# Patient Record
Sex: Male | Born: 1950 | Race: White | Hispanic: No | Marital: Married | Smoking: Never smoker
Health system: Southern US, Community
[De-identification: ages and names within clinical notes are randomized; demographics above are authoritative.]

## PROBLEM LIST (undated history)

## (undated) DIAGNOSIS — E785 Hyperlipidemia, unspecified: Secondary | ICD-10-CM

## (undated) DIAGNOSIS — I499 Cardiac arrhythmia, unspecified: Secondary | ICD-10-CM

## (undated) DIAGNOSIS — I10 Essential (primary) hypertension: Secondary | ICD-10-CM

## (undated) DIAGNOSIS — B370 Candidal stomatitis: Secondary | ICD-10-CM

## (undated) DIAGNOSIS — R030 Elevated blood-pressure reading, without diagnosis of hypertension: Secondary | ICD-10-CM

## (undated) DIAGNOSIS — M722 Plantar fascial fibromatosis: Secondary | ICD-10-CM

## (undated) DIAGNOSIS — R9439 Abnormal result of other cardiovascular function study: Secondary | ICD-10-CM

## (undated) DIAGNOSIS — M1990 Unspecified osteoarthritis, unspecified site: Secondary | ICD-10-CM

## (undated) HISTORY — PX: JOINT REPLACEMENT: SHX530

## (undated) HISTORY — PX: COLONOSCOPY: SHX174

## (undated) HISTORY — PX: OTHER SURGICAL HISTORY: SHX169

## (undated) HISTORY — PX: TONSILLECTOMY: SUR1361

---

## 1999-02-03 DIAGNOSIS — Z87442 Personal history of urinary calculi: Secondary | ICD-10-CM

## 1999-02-03 HISTORY — DX: Personal history of urinary calculi: Z87.442

## 2003-10-25 ENCOUNTER — Other Ambulatory Visit: Payer: Self-pay

## 2004-02-29 ENCOUNTER — Ambulatory Visit: Payer: Self-pay | Admitting: Unknown Physician Specialty

## 2006-02-14 ENCOUNTER — Emergency Department: Payer: Self-pay | Admitting: Emergency Medicine

## 2006-02-17 ENCOUNTER — Ambulatory Visit: Payer: Self-pay | Admitting: Internal Medicine

## 2010-11-03 ENCOUNTER — Ambulatory Visit: Payer: Self-pay | Admitting: Urology

## 2011-11-04 DIAGNOSIS — Z87442 Personal history of urinary calculi: Secondary | ICD-10-CM | POA: Insufficient documentation

## 2011-11-04 DIAGNOSIS — N401 Enlarged prostate with lower urinary tract symptoms: Secondary | ICD-10-CM | POA: Insufficient documentation

## 2011-11-04 DIAGNOSIS — R3129 Other microscopic hematuria: Secondary | ICD-10-CM | POA: Insufficient documentation

## 2014-04-17 DIAGNOSIS — B370 Candidal stomatitis: Secondary | ICD-10-CM

## 2014-04-17 HISTORY — DX: Candidal stomatitis: B37.0

## 2014-04-25 DIAGNOSIS — R339 Retention of urine, unspecified: Secondary | ICD-10-CM | POA: Insufficient documentation

## 2014-04-25 DIAGNOSIS — R972 Elevated prostate specific antigen [PSA]: Secondary | ICD-10-CM | POA: Insufficient documentation

## 2014-11-07 ENCOUNTER — Encounter: Payer: Self-pay | Admitting: *Deleted

## 2014-11-08 ENCOUNTER — Ambulatory Visit
Admission: RE | Admit: 2014-11-08 | Discharge: 2014-11-08 | Disposition: A | Discharge status: Home / Self Care | Payer: BC Managed Care – PPO | Source: Ambulatory Visit | Attending: Unknown Physician Specialty | Admitting: Unknown Physician Specialty

## 2014-11-08 ENCOUNTER — Encounter
Admission: RE | Disposition: A | Discharge status: Home / Self Care | Payer: Self-pay | Source: Ambulatory Visit | Attending: Unknown Physician Specialty

## 2014-11-08 ENCOUNTER — Ambulatory Visit: Payer: BC Managed Care – PPO | Admitting: Anesthesiology

## 2014-11-08 DIAGNOSIS — K621 Rectal polyp: Secondary | ICD-10-CM | POA: Insufficient documentation

## 2014-11-08 DIAGNOSIS — E785 Hyperlipidemia, unspecified: Secondary | ICD-10-CM | POA: Insufficient documentation

## 2014-11-08 DIAGNOSIS — K640 First degree hemorrhoids: Secondary | ICD-10-CM | POA: Diagnosis not present

## 2014-11-08 DIAGNOSIS — R030 Elevated blood-pressure reading, without diagnosis of hypertension: Secondary | ICD-10-CM | POA: Insufficient documentation

## 2014-11-08 DIAGNOSIS — K5730 Diverticulosis of large intestine without perforation or abscess without bleeding: Secondary | ICD-10-CM | POA: Diagnosis not present

## 2014-11-08 DIAGNOSIS — Z1211 Encounter for screening for malignant neoplasm of colon: Secondary | ICD-10-CM | POA: Insufficient documentation

## 2014-11-08 DIAGNOSIS — Z79899 Other long term (current) drug therapy: Secondary | ICD-10-CM | POA: Insufficient documentation

## 2014-11-08 HISTORY — DX: Plantar fascial fibromatosis: M72.2

## 2014-11-08 HISTORY — DX: Candidal stomatitis: B37.0

## 2014-11-08 HISTORY — DX: Hyperlipidemia, unspecified: E78.5

## 2014-11-08 HISTORY — DX: Elevated blood-pressure reading, without diagnosis of hypertension: R03.0

## 2014-11-08 HISTORY — DX: Abnormal result of other cardiovascular function study: R94.39

## 2014-11-08 HISTORY — PX: COLONOSCOPY WITH PROPOFOL: SHX5780

## 2014-11-08 SURGERY — COLONOSCOPY WITH PROPOFOL
Anesthesia: General

## 2014-11-08 MED ORDER — SODIUM CHLORIDE 0.9 % IV SOLN
INTRAVENOUS | Status: DC
  Administered 2014-11-08: 09:00:00 via INTRAVENOUS

## 2014-11-08 MED ORDER — LIDOCAINE HCL (CARDIAC) 20 MG/ML IV SOLN
INTRAVENOUS | Status: DC | PRN
  Administered 2014-11-08: 40 mg via INTRAVENOUS

## 2014-11-08 MED ORDER — PROPOFOL 10 MG/ML IV BOLUS
INTRAVENOUS | Status: DC | PRN
  Administered 2014-11-08: 50 mg via INTRAVENOUS
  Administered 2014-11-08: 10 mg via INTRAVENOUS
  Administered 2014-11-08: 20 mg via INTRAVENOUS

## 2014-11-08 MED ORDER — SODIUM CHLORIDE 0.9 % IV SOLN: INTRAVENOUS | Status: DC

## 2014-11-08 MED ORDER — PROPOFOL 500 MG/50ML IV EMUL
INTRAVENOUS | Status: DC | PRN
  Administered 2014-11-08: 160 ug/kg/min via INTRAVENOUS

## 2014-11-08 NOTE — H&P (Signed)
   Primary Care Physician:  No primary care provider on file. Primary Gastroenterologist:  Dr. Vira Agar  Pre-Procedure History & Physical: HPI:  Gary Escobar is a 64 y.o. male is here for an colonoscopy.   Past Medical History  Diagnosis Date  . Borderline hypertension   . Hyperlipidemia   . Plantar fasciitis   . Positive cardiac stress test   . Oral thrush     Past Surgical History  Procedure Laterality Date  . Right knee arthroscopy    . Colonoscopy      Prior to Admission medications   Medication Sig Start Date End Date Taking? Authorizing Provider  Flaxseed, Linseed, (FLAXSEED OIL PO) Take by mouth.   Yes Historical Provider, MD  Multiple Vitamin (MULTIVITAMIN) tablet Take 1 tablet by mouth daily.   Yes Historical Provider, MD  nystatin (MYCOSTATIN) 100000 UNIT/ML suspension Take 5 mLs by mouth 4 (four) times daily.   Yes Historical Provider, MD  Omega 3 340 MG CPDR Take by mouth.   Yes Historical Provider, MD    Allergies as of 07/24/2014  . (Not on File)    No family history on file.  Social History   Social History  . Marital Status: Married    Spouse Name: N/A  . Number of Children: N/A  . Years of Education: N/A   Occupational History  . Not on file.   Social History Main Topics  . Smoking status: Not on file  . Smokeless tobacco: Not on file  . Alcohol Use: Not on file  . Drug Use: Not on file  . Sexual Activity: Not on file   Other Topics Concern  . Not on file   Social History Narrative  . No narrative on file    Review of Systems: See HPI, otherwise negative ROS  Physical Exam: BP 117/75 mmHg  Pulse 72  Temp(Src) 98.2 F (36.8 C) (Oral)  Resp 17  Ht 5\' 8"  (1.727 m)  Wt 90.719 kg (200 lb)  BMI 30.42 kg/m2  SpO2 100% General:   Alert,  pleasant and cooperative in NAD Head:  Normocephalic and atraumatic. Neck:  Supple; no masses or thyromegaly. Lungs:  Clear throughout to auscultation.    Heart:  Regular rate and  rhythm. Abdomen:  Soft, nontender and nondistended. Normal bowel sounds, without guarding, and without rebound.   Neurologic:  Alert and  oriented x4;  grossly normal neurologically.  Impression/Plan: Gary Escobar is here for an colonoscopy to be performed for screening  Risks, benefits, limitations, and alternatives regarding  colonoscopy have been reviewed with the patient.  Questions have been answered.  All parties agreeable.   Gaylyn Cheers, MD  11/08/2014, 9:05 AM

## 2014-11-08 NOTE — Anesthesia Postprocedure Evaluation (Signed)
  Anesthesia Post-op Note  Patient: Gary Escobar  Procedure(s) Performed: Procedure(s): COLONOSCOPY WITH PROPOFOL (N/A)  Anesthesia type:General  Patient location: PACU  Post pain: Pain level controlled  Post assessment: Post-op Vital signs reviewed, Patient's Cardiovascular Status Stable, Respiratory Function Stable, Patent Airway and No signs of Nausea or vomiting  Post vital signs: Reviewed and stable  Last Vitals:  Filed Vitals:   11/08/14 1003  BP: 81/62  Pulse: 73  Temp:   Resp: 15    Level of consciousness: awake, alert  and patient cooperative  Complications: No apparent anesthesia complications

## 2014-11-08 NOTE — Op Note (Signed)
[facility] Gastroenterology Patient Name: Gary Escobar Procedure Date: 11/08/2014 9:09 AM MRN: [id number] Account #: [id number] Date of Birth: 1950-12-06 Admit Type: Outpatient Age: 64 Room: [facility] ENDO ROOM 1 Gender: Male Note Status: Finalized Procedure:         Colonoscopy Indications:       Screening for colorectal malignant neoplasm Providers:         Manya Silvas, MD Referring MD:      Tracie Harrier, MD (Referring MD) Medicines:         Propofol per Anesthesia Complications:     No immediate complications. Procedure:         Pre-Anesthesia Assessment:                    - After reviewing the risks and benefits, the patient was                     deemed in satisfactory condition to undergo the procedure.                    After obtaining informed consent, the colonoscope was                     passed under direct vision. Throughout the procedure, the                     patient's blood pressure, pulse, and oxygen saturations                     were monitored continuously. The Olympus PCF-H180AL                     colonoscope ( S#: [id number] ) was introduced through the                     anus and advanced to the the cecum, identified by                     appendiceal orifice and ileocecal valve. The colonoscopy                     was performed without difficulty. The patient tolerated                     the procedure well. The quality of the bowel preparation                     was excellent. Findings:      A 3 mm polyp was found in the rectum. The polyp was sessile. The polyp       was removed with a jumbo cold forceps. Resection and retrieval were       complete.      Many small-mouthed diverticula were found in the sigmoid colon and in       the descending colon.      Internal hemorrhoids were found during endoscopy. The hemorrhoids were       small and Grade I (internal hemorrhoids that do not prolapse).      The exam was otherwise without  abnormality. Impression:        - One 3 mm polyp in the rectum. Resected and retrieved.                    - Diverticulosis in the sigmoid colon and  in the                     descending colon.                    - Internal hemorrhoids.                    - The examination was otherwise normal. Recommendation:    - Await pathology results. Manya Silvas, MD 11/08/2014 9:41:07 AM This report has been signed electronically. Number of Addenda: 0 Note Initiated On: 11/08/2014 9:09 AM Scope Withdrawal Time: 0 hours 13 minutes 4 seconds  Total Procedure Duration: 0 hours 21 minutes 37 seconds       [facility]

## 2014-11-08 NOTE — Anesthesia Procedure Notes (Signed)
Performed by: Ruby Dilone Pre-anesthesia Checklist: Patient identified, Emergency Drugs available, Suction available, Patient being monitored and Timeout performed Patient Re-evaluated:Patient Re-evaluated prior to inductionOxygen Delivery Method: Nasal cannula Intubation Type: IV induction       

## 2014-11-08 NOTE — Transfer of Care (Signed)
Immediate Anesthesia Transfer of Care Note  Patient: Gary Escobar  Procedure(s) Performed: Procedure(s): COLONOSCOPY WITH PROPOFOL (N/A)  Patient Location: PACU  Anesthesia Type:General    Level of Consciousness: sedated  Airway & Oxygen Therapy: Patient Spontanous Breathing and Patient connected to nasal cannula oxygen  Post-op Assessment: Report given to RN and Post -op Vital signs reviewed and stable  Post vital signs: Reviewed and stable  Last Vitals:  Filed Vitals:   11/08/14 0819  BP: 117/75  Pulse: 72  Temp: 36.8 C  Resp: 17    Complications: No apparent anesthesia complications

## 2014-11-08 NOTE — Anesthesia Preprocedure Evaluation (Signed)
Anesthesia Evaluation  Patient identified by MRN, date of birth, ID band Patient awake    Reviewed: Allergy & Precautions, NPO status , Patient's Chart, lab work & pertinent test results  History of Anesthesia Complications (+) history of anesthetic complications  Airway Mallampati: II  TM Distance: >3 FB     Dental  (+) Teeth Intact   Pulmonary neg pulmonary ROS,           Cardiovascular negative cardio ROS       Neuro/Psych negative neurological ROS     GI/Hepatic negative GI ROS, Neg liver ROS,   Endo/Other  negative endocrine ROS  Renal/GU negative Renal ROS     Musculoskeletal   Abdominal   Peds  Hematology   Anesthesia Other Findings   Reproductive/Obstetrics                             Anesthesia Physical Anesthesia Plan  ASA: I  Anesthesia Plan: General   Post-op Pain Management:    Induction: Intravenous  Airway Management Planned: Nasal Cannula  Additional Equipment:   Intra-op Plan:   Post-operative Plan:   Informed Consent: I have reviewed the patients History and Physical, chart, labs and discussed the procedure including the risks, benefits and alternatives for the proposed anesthesia with the patient or authorized representative who has indicated his/her understanding and acceptance.     Plan Discussed with:   Anesthesia Plan Comments:         Anesthesia Quick Evaluation

## 2014-11-09 LAB — SURGICAL PATHOLOGY

## 2014-11-12 ENCOUNTER — Encounter: Payer: Self-pay | Admitting: Unknown Physician Specialty

## 2016-10-22 ENCOUNTER — Ambulatory Visit
Admission: RE | Admit: 2016-10-22 | Discharge: 2016-10-22 | Disposition: A | Discharge status: Home / Self Care | Payer: BC Managed Care – PPO | Source: Ambulatory Visit | Attending: Sports Medicine | Admitting: Sports Medicine

## 2016-10-22 ENCOUNTER — Encounter: Payer: Self-pay | Admitting: Sports Medicine

## 2016-10-22 ENCOUNTER — Ambulatory Visit (INDEPENDENT_AMBULATORY_CARE_PROVIDER_SITE_OTHER): Payer: BC Managed Care – PPO | Admitting: Sports Medicine

## 2016-10-22 VITALS — BP 130/80 | Ht 68.0 in | Wt 204.0 lb

## 2016-10-22 DIAGNOSIS — M2170 Unequal limb length (acquired), unspecified site: Secondary | ICD-10-CM | POA: Insufficient documentation

## 2016-10-22 DIAGNOSIS — M1711 Unilateral primary osteoarthritis, right knee: Secondary | ICD-10-CM | POA: Diagnosis not present

## 2016-10-22 DIAGNOSIS — M1612 Unilateral primary osteoarthritis, left hip: Secondary | ICD-10-CM | POA: Diagnosis not present

## 2016-10-22 DIAGNOSIS — M25561 Pain in right knee: Secondary | ICD-10-CM

## 2016-10-22 MED ORDER — TRAMADOL HCL 50 MG PO TABS: 50.0000 mg | ORAL_TABLET | Freq: Two times a day (BID) | ORAL | 2 refills | Status: DC | PRN

## 2016-10-22 NOTE — Patient Instructions (Signed)
[facility] [address] [place], [place] [phone]

## 2016-10-22 NOTE — Progress Notes (Signed)
HPI  CC: Right knee pain, left hip pain Patient is here with complaints of right-sided knee pain. He states that this ailment has been causing him discomfort for many years. He states that this knee aches along the medial aspect. He has noticed some reduced range of motion specifically with extension as well as some swelling in the back of his knee. He denies any feelings of instability. He endorses occasional swelling. He denies any mechanical locking. He denies any recent trauma, injury, or falls. However, he endorses injuries in the past specifically one in 2006 which required a meniscectomy at that time. Patient's wife states that for many years he seems to "hobble" while walking, which she is concerned it is contributing to much of his pain.  Left hip: Patient is also here with complaints of left hip pain. He states that this discomfort started after the knee but has also been going on for quite some time. Pain is located along the buttock with occasional involvement of the groin. He states that his range of motion in this leg is significantly limited and causes him to have some trouble with putting on socks and shoes. He is worried that this is a results of his "hobbling" gait. He denies any specific injury, trauma, or falls related to this joint.  Traumatic: No -- Knee likely "chronic post-traumatic"  Location: Right knee, left hip  Quality: Aching and stiff  Duration: Many years  Timing: First thing in the morning and after prolonged sitting and standing  Improving/Worsening: Gradually worsening  Makes better: Rest  Makes worse: Persistent ambulation  Associated symptoms: None   Previous Interventions Tried: none   Past Injuries: Meniscus tear in 2006  Past Surgeries: Meniscectomy in 2006  Smoking: non  Family Hx: Noncontributory   ROS: Per HPI; in addition no fever, no rash, no additional weakness, no additional numbness, no additional paresthesias, and no additional  falls/injury.   Objective: BP 130/80   Ht 5\' 8"  (1.727 m)   Wt 204 lb (92.5 kg)   BMI 31.02 kg/m  Gen: NAD, well groomed, a/o x3, normal affect.  CV: Well-perfused. Warm.  Resp: Non-labored.  Neuro: Sensation intact throughout. No gross coordination deficits.  Gait: Nonpathologic posture, significant Trendelenburg gait on the left. Right knee has a varus thrust. Knee, Right: TTP noted at the medial joint line. Inspection was negative for erythema or ecchymosis. Mild effusion present. Mildly obvious bony abnormalities  c/w osteophyte development. Palpation yielded no asymmetric warmth; No condyle tenderness; No patellar tenderness; +1 patellar crepitus. Patellar and quadriceps tendons unremarkable, and no tenderness of the pes anserine bursa. Moderately sized Baker's cyst noted. ROM limited in flexion (~110 degrees) and extension (10 degrees). Normal hamstring and quadriceps strength. Neurovascularly intact bilaterally.  - Ligaments: (Solid and consistent endpoints)   - ACL (present bilaterally)   - PCL (present bilaterally)   - LCL (present bilaterally)   - MCL (present bilaterally).   - Meniscus:   - Thessaly: NEG  - Patella:   - Patellar grind/compression: NEG   - Patellar glide: Without apprehension Hip, Left: TTP noted at the mid buttock and posterior GT, as well as the groin. No obvious rash, erythema, ecchymosis, or edema. ROM limited in IR and ER (IR>ER); Strength intact. Pelvic alignment unremarkable to inspection and palpation. Greater trochanter without tenderness to palpation. No tenderness over piriformis. No SI joint tenderness and normal minimal SI movement. Leg Length: Right side appears to be approximately 1.5 cm longer than the left. Moderate  correction with sitting up.    Assessment and Plan:  Osteoarthritis of right knee Patient is here with complaints  Consistent with right sided osteoarthritis. Likely posttraumatic, chronic.  - Conservative therapy at this time.  Had a long discussion about the likelihood of total knee arthroplasty as a cure. - Obtaining standing x-rays. - Provided left-sided heel lift to help with leg length discrepancy - Tramadol as needed for pain - Follow-up as needed  Primary osteoarthritis of left hip Patient is here with complaints consistent with primary osteoarthritis of the left hip. Likely some association with patient's right knee issues, and leg length discrepancy. - Discussed the possibility of hip replacement. - Referral to orthopedic surgery - Home exercise program provided - Tramadol provided for pain as needed  Leg length discrepancy See plan above   Orders Placed This Encounter  Procedures  . DG Knee Complete 4 Views Right    Standing Status:   Future    Number of Occurrences:   1    Standing Expiration Date:   12/22/2017    Order Specific Question:   Reason for Exam (SYMPTOM  OR DIAGNOSIS REQUIRED)    Answer:   right knee pain; standing AP, lateral, sunrise and tunnel views    Order Specific Question:   Preferred imaging location?    Answer:   [facility]    Order Specific Question:   Radiology Contrast Protocol - do NOT remove file path    Answer:   [email or web address]  . Ambulatory referral to Orthopedic Surgery    Referral Priority:   Routine    Referral Type:   Surgical    Referral Reason:   Specialty Services Required    Referred to Provider:   Gaynelle Arabian, MD    Requested Specialty:   Orthopedic Surgery    Number of Visits Requested:   1    Meds ordered this encounter  Medications  . traMADol (ULTRAM) 50 MG tablet    Sig: Take 1 tablet (50 mg total) by mouth every 12 (twelve) hours as needed.    Dispense:  60 tablet    Refill:  2     Elberta Leatherwood, MD,MS [facility] 10/22/2016 1:19 PM

## 2016-10-22 NOTE — Assessment & Plan Note (Signed)
See plan above.

## 2016-10-22 NOTE — Assessment & Plan Note (Signed)
Patient is here with complaints consistent with primary osteoarthritis of the left hip. Likely some association with patient's right knee issues, and leg length discrepancy. - Discussed the possibility of hip replacement. - Referral to orthopedic surgery - Home exercise program provided - Tramadol provided for pain as needed

## 2016-10-22 NOTE — Assessment & Plan Note (Signed)
Patient is here with complaints  Consistent with right sided osteoarthritis. Likely posttraumatic, chronic.  - Conservative therapy at this time. Had a long discussion about the likelihood of total knee arthroplasty as a cure. - Obtaining standing x-rays. - Provided left-sided heel lift to help with leg length discrepancy - Tramadol as needed for pain - Follow-up as needed

## 2017-02-02 DIAGNOSIS — I499 Cardiac arrhythmia, unspecified: Secondary | ICD-10-CM

## 2017-02-02 HISTORY — DX: Cardiac arrhythmia, unspecified: I49.9

## 2017-06-29 DIAGNOSIS — Z8042 Family history of malignant neoplasm of prostate: Secondary | ICD-10-CM | POA: Insufficient documentation

## 2018-01-17 DIAGNOSIS — I10 Essential (primary) hypertension: Secondary | ICD-10-CM | POA: Insufficient documentation

## 2018-02-07 DIAGNOSIS — E785 Hyperlipidemia, unspecified: Secondary | ICD-10-CM | POA: Insufficient documentation

## 2018-02-09 ENCOUNTER — Other Ambulatory Visit
Admission: RE | Admit: 2018-02-09 | Discharge: 2018-02-09 | Disposition: A | Discharge status: Home / Self Care | Payer: BC Managed Care – PPO | Source: Ambulatory Visit | Attending: Internal Medicine | Admitting: Internal Medicine

## 2018-02-09 DIAGNOSIS — I4892 Unspecified atrial flutter: Secondary | ICD-10-CM | POA: Diagnosis present

## 2018-02-09 DIAGNOSIS — I4891 Unspecified atrial fibrillation: Secondary | ICD-10-CM | POA: Diagnosis not present

## 2018-02-09 LAB — BRAIN NATRIURETIC PEPTIDE: B Natriuretic Peptide: 36 pg/mL (ref 0.0–100.0)

## 2018-02-11 ENCOUNTER — Ambulatory Visit: Admission: RE | Admit: 2018-02-11 | Payer: BC Managed Care – PPO | Source: Home / Self Care | Admitting: Cardiology

## 2018-02-11 ENCOUNTER — Encounter: Admission: RE | Payer: Self-pay | Source: Home / Self Care

## 2018-02-11 SURGERY — CARDIOVERSION (CATH LAB)
Anesthesia: Moderate Sedation

## 2018-03-10 ENCOUNTER — Ambulatory Visit: Payer: BC Managed Care – PPO | Attending: Neurology

## 2018-03-10 DIAGNOSIS — R400 Somnolence: Secondary | ICD-10-CM | POA: Diagnosis not present

## 2018-03-10 DIAGNOSIS — G4733 Obstructive sleep apnea (adult) (pediatric): Secondary | ICD-10-CM | POA: Insufficient documentation

## 2018-03-10 DIAGNOSIS — I483 Typical atrial flutter: Secondary | ICD-10-CM | POA: Diagnosis not present

## 2018-12-19 DIAGNOSIS — G4733 Obstructive sleep apnea (adult) (pediatric): Secondary | ICD-10-CM | POA: Insufficient documentation

## 2018-12-19 DIAGNOSIS — I4892 Unspecified atrial flutter: Secondary | ICD-10-CM | POA: Insufficient documentation

## 2019-05-11 ENCOUNTER — Ambulatory Visit: Payer: Self-pay | Admitting: Urology

## 2019-06-07 ENCOUNTER — Other Ambulatory Visit: Payer: Self-pay

## 2019-06-07 ENCOUNTER — Ambulatory Visit (INDEPENDENT_AMBULATORY_CARE_PROVIDER_SITE_OTHER): Payer: Medicare PPO | Admitting: Urology

## 2019-06-07 VITALS — BP 129/82 | HR 84 | Ht 68.0 in | Wt 214.0 lb

## 2019-06-07 DIAGNOSIS — N529 Male erectile dysfunction, unspecified: Secondary | ICD-10-CM

## 2019-06-07 DIAGNOSIS — R319 Hematuria, unspecified: Secondary | ICD-10-CM

## 2019-06-07 MED ORDER — TADALAFIL 5 MG PO TABS: 5.0000 mg | ORAL_TABLET | Freq: Every day | ORAL | 11 refills | Status: DC | PRN

## 2019-06-07 NOTE — Progress Notes (Signed)
   06/07/19 10:08 AM   Tobe Sos 22-Jul-1950 [id number]  CC: Hematuria, ED  HPI: I saw Mr. Brazill in urology clinic today for the above issues.  He was previously followed by Dr. Jacqlyn Larsen.  Is a 69 year old relatively healthy male with a cardiac history on Eliquis and a long history of microscopic hematuria.  He last underwent work-up with Dr. Jacqlyn Larsen in 2012 with CT and cystoscopy which was reportedly normal.  He does report one episode of gross hematuria associated with some dysuria during 1 urination about 6 months ago, but no other episodes of gross hematuria.  This resolved spontaneously.  Urinalysis today shows persistent microscopic hematuria with 3-10 RBCs, but no other suspicious findings.  He denies any voiding complaints.  He has moderate erectile dysfunction that previously has been improved with sildenafil, but he has not taken this medication lately.  He has a family history of nonlethal prostate cancer.  He did not tolerate the cystoscopy for prior hematuria work-up well with significant pain reportedly.  He is a never smoker and denies any other carcinogenic exposures.  He is a retired Chief Financial Officer.  He has had significant hip and knee problems and is looking into scheduling joint replacement.  Last PSA was well within the normal range at 0.9 in December 2020.   PMH: Past Medical History:  Diagnosis Date  . Borderline hypertension   . Hyperlipidemia   . Oral thrush   . Plantar fasciitis   . Positive cardiac stress test     Family History: Family history of nonlethal prostate cancer  Social History:  reports that he has never smoked. He has never used smokeless tobacco. No history on file for alcohol and drug.  Physical Exam: BP 129/82   Pulse 84   Ht 5\' 8"  (1.727 m)   Wt 214 lb (97.1 kg)   BMI 32.54 kg/m    Constitutional:  Alert and oriented, No acute distress. Cardiovascular: No clubbing, cyanosis, or edema. Respiratory: Normal respiratory effort, no increased work of  breathing. GI: Abdomen is soft, nontender, nondistended, no abdominal masses DRE: 30 g, limited by body habitus, smooth no nodules  Laboratory Data: Reviewed, see HPI  Assessment & Plan:   In summary, is a 69 year old male with a single episode of gross hematuria associated with dysuria as well as persistent microscopic hematuria, as well as erectile dysfunction.  I did recommend repeat work-up for his hematuria with CT urogram and cystoscopy, he deferred cystoscopy at this time secondary to the significant amount of pain he had previously.  We discussed that findings of the CT urogram may warrant further work-up.  He also is interested in a trial of Cialis for his ED, and we discussed the risk and benefits at length.  Cialis 5 to 10 mg on demand for ED CT urogram RTC 6 weeks to discuss results   Nickolas Madrid, MD 06/07/2019  [facility] [address], [place], [place] [address] [phone]

## 2019-06-07 NOTE — Patient Instructions (Signed)
Prostate Cancer Screening  Prostate cancer screening is a test that is done to check for the presence of prostate cancer in men. The prostate gland is a walnut-sized gland that is located below the bladder and in front of the rectum in males. The function of the prostate is to add fluid to semen during ejaculation. Prostate cancer is the second most common type of cancer in men. Who should have prostate cancer screening?  Screening recommendations vary based on age and other risk factors. Screening is recommended if:  You are older than age 55. If you are age 55-69, talk with your health care provider about your need for screening and how often screening should be done. Because most prostate cancers are slow growing and will not cause death, screening is generally reserved in this age group for men who have a 10-15-year life expectancy.  You are younger than age 55, and you have these risk factors: ? Being a black male or a male of African descent. ? Having a father, brother, or uncle who has been diagnosed with prostate cancer. The risk is higher if your family member's cancer occurred at an early age. Screening is not recommended if:  You are younger than age 40.  You are between the ages of 40 and 54 and you have no risk factors.  You are 69 years of age or older. At this age, the risks that screening can cause are greater than the benefits that it may provide. If you are at high risk for prostate cancer, your health care provider may recommend that you have screenings more often or that you start screening at a younger age. How is screening for prostate cancer done? The recommended prostate cancer screening test is a blood test called the prostate-specific antigen (PSA) test. PSA is a protein that is made in the prostate. As you age, your prostate naturally produces more PSA. Abnormally high PSA levels may be caused by:  Prostate cancer.  An enlarged prostate that is not caused by cancer  (benign prostatic hyperplasia, BPH). This condition is very common in older men.  A prostate gland infection (prostatitis). Depending on the PSA results, you may need more tests, such as:  A physical exam to check the size of your prostate gland.  Blood and imaging tests.  A procedure to remove tissue samples from your prostate gland for testing (biopsy). What are the benefits of prostate cancer screening?  Screening can help to identify cancer at an early stage, before symptoms start and when the cancer can be treated more easily.  There is a small chance that screening may lower your risk of dying from prostate cancer. The chance is small because prostate cancer is a slow-growing cancer, and most men with prostate cancer die from a different cause. What are the risks of prostate cancer screening? The main risk of prostate cancer screening is diagnosing and treating prostate cancer that would never have caused any symptoms or problems. This is called overdiagnosisand overtreatment. PSA screening cannot tell you if your PSA is high due to cancer or a different cause. A prostate biopsy is the only procedure to diagnose prostate cancer. Even the results of a biopsy may not tell you if your cancer needs to be treated. Slow-growing prostate cancer may not need any treatment other than monitoring, so diagnosing and treating it may cause unnecessary stress or other side effects. A prostate biopsy may also cause:  Infection or fever.  A false negative. This is   a result that shows that you do not have prostate cancer when you actually do have prostate cancer. Questions to ask your health care provider  When should I start prostate cancer screening?  What is my risk for prostate cancer?  How often do I need screening?  What type of screening tests do I need?  How do I get my test results?  What do my results mean?  Do I need treatment? Where to find more information  The American Cancer  Society: www.cancer.org  American Urological Association: www.auanet.org Contact a health care provider if:  You have difficulty urinating.  You have pain when you urinate or ejaculate.  You have blood in your urine or semen.  You have pain in your back or in the area of your prostate. Summary  Prostate cancer is a common type of cancer in men. The prostate gland is located below the bladder and in front of the rectum. This gland adds fluid to semen during ejaculation.  Prostate cancer screening may identify cancer at an early stage, when the cancer can be treated more easily.  The prostate-specific antigen (PSA) test is the recommended screening test for prostate cancer.  Discuss the risks and benefits of prostate cancer screening with your health care provider. If you are age 69 or older, the risks that screening can cause are greater than the benefits that it may provide. This information is not intended to replace advice given to you by your health care provider. Make sure you discuss any questions you have with your health care provider. Document Revised: 09/01/2018 Document Reviewed: 09/01/2018 Elsevier Patient Education  2020 [employer].  Tadalafil tablets (Cialis) What is this medicine? TADALAFIL (tah DA la fil) is used to treat erection problems in men. It is also used for enlargement of the prostate gland in men, a condition called benign prostatic hyperplasia or BPH. This medicine improves urine flow and reduces BPH symptoms. This medicine can also treat both erection problems and BPH when they occur together. This medicine may be used for other purposes; ask your health care provider or pharmacist if you have questions. COMMON BRAND NAME(S): Adcirca, ALYQ, Cialis What should I tell my health care provider before I take this medicine? They need to know if you have any of these conditions:  bleeding disorders  eye or vision problems, including a rare inherited eye  disease called retinitis pigmentosa  anatomical deformation of the penis, Peyronie's disease, or history of priapism (painful and prolonged erection)  heart disease, angina, a history of heart attack, irregular heart beats, or other heart problems  high or low blood pressure  history of blood diseases, like sickle cell anemia or leukemia  history of stomach bleeding  kidney disease  liver disease  stroke  an unusual or allergic reaction to tadalafil, other medicines, foods, dyes, or preservatives  pregnant or trying to get pregnant  breast-feeding How should I use this medicine? Take this medicine by mouth with a glass of water. Follow the directions on the prescription label. You may take this medicine with or without meals. When this medicine is used for erection problems, your doctor may prescribe it to be taken once daily or as needed. If you are taking the medicine as needed, you may be able to have sexual activity 30 minutes after taking it and for up to 36 hours after taking it. Whether you are taking the medicine as needed or once daily, you should not take more than one dose   per day. If you are taking this medicine for symptoms of benign prostatic hyperplasia (BPH) or to treat both BPH and an erection problem, take the dose once daily at about the same time each day. Do not take your medicine more often than directed. Talk to your pediatrician regarding the use of this medicine in children. Special care may be needed. Overdosage: If you think you have taken too much of this medicine contact a poison control center or emergency room at once. NOTE: This medicine is only for you. Do not share this medicine with others. What if I miss a dose? If you are taking this medicine as needed for erection problems, this does not apply. If you miss a dose while taking this medicine once daily for an erection problem, benign prostatic hyperplasia, or both, take it as soon as you remember, but  do not take more than one dose per day. What may interact with this medicine? Do not take this medicine with any of the following medications:  nitrates like amyl nitrite, isosorbide dinitrate, isosorbide mononitrate, nitroglycerin  other medicines for erectile dysfunction like avanafil, sildenafil, vardenafil  other tadalafil products (Adcirca)  riociguat This medicine may also interact with the following medications:  certain drugs for high blood pressure  certain drugs for the treatment of HIV infection or AIDS  certain drugs used for fungal or yeast infections, like fluconazole, itraconazole, ketoconazole, and voriconazole  certain drugs used for seizures like carbamazepine, phenytoin, and phenobarbital  grapefruit juice  macrolide antibiotics like clarithromycin, erythromycin, troleandomycin  medicines for prostate problems  rifabutin, rifampin or rifapentine This list may not describe all possible interactions. Give your health care provider a list of all the medicines, herbs, non-prescription drugs, or dietary supplements you use. Also tell them if you smoke, drink alcohol, or use illegal drugs. Some items may interact with your medicine. What should I watch for while using this medicine? If you notice any changes in your vision while taking this drug, call your doctor or health care professional as soon as possible. Stop using this medicine and call your health care provider right away if you have a loss of sight in one or both eyes. Contact your doctor or health care professional right away if the erection lasts longer than 4 hours or if it becomes painful. This may be a sign of serious problem and must be treated right away to prevent permanent damage. If you experience symptoms of nausea, dizziness, chest pain or arm pain upon initiation of sexual activity after taking this medicine, you should refrain from further activity and call your doctor or health care professional  as soon as possible. Do not drink alcohol to excess (examples, 5 glasses of wine or 5 shots of whiskey) when taking this medicine. When taken in excess, alcohol can increase your chances of getting a headache or getting dizzy, increasing your heart rate or lowering your blood pressure. Using this medicine does not protect you or your partner against HIV infection (the virus that causes AIDS) or other sexually transmitted diseases. What side effects may I notice from receiving this medicine? Side effects that you should report to your doctor or health care professional as soon as possible:  allergic reactions like skin rash, itching or hives, swelling of the face, lips, or tongue  breathing problems  changes in hearing  changes in vision  chest pain  fast, irregular heartbeat  prolonged or painful erection  seizures Side effects that usually do not require medical   attention (report to your doctor or health care professional if they continue or are bothersome):  back pain  dizziness  flushing  headache  indigestion  muscle aches  nausea  stuffy or runny nose This list may not describe all possible side effects. Call your doctor for medical advice about side effects. You may report side effects to FDA at 1-800-FDA-1088. Where should I keep my medicine? Keep out of the reach of children. Store at room temperature between 15 and 30 degrees C (59 and 86 degrees F). Throw away any unused medicine after the expiration date. NOTE: This sheet is a summary. It may not cover all possible information. If you have questions about this medicine, talk to your doctor, pharmacist, or health care provider.  2020 Elsevier/Gold Standard (2013-06-09 13:15:49)   

## 2019-06-08 LAB — MICROSCOPIC EXAMINATION

## 2019-06-08 LAB — URINALYSIS, COMPLETE
Bilirubin, UA: NEGATIVE
Glucose, UA: NEGATIVE
Ketones, UA: NEGATIVE
Leukocytes,UA: NEGATIVE
Nitrite, UA: NEGATIVE
Protein,UA: NEGATIVE
Specific Gravity, UA: 1.025 (ref 1.005–1.030)
Urobilinogen, Ur: 0.2 mg/dL (ref 0.2–1.0)
pH, UA: 5.5 (ref 5.0–7.5)

## 2019-07-04 ENCOUNTER — Ambulatory Visit
Admission: RE | Admit: 2019-07-04 | Discharge: 2019-07-04 | Disposition: A | Discharge status: Home / Self Care | Payer: Medicare PPO | Source: Ambulatory Visit | Attending: Urology | Admitting: Urology

## 2019-07-04 ENCOUNTER — Telehealth: Payer: Self-pay

## 2019-07-04 ENCOUNTER — Other Ambulatory Visit: Payer: Self-pay

## 2019-07-04 DIAGNOSIS — R319 Hematuria, unspecified: Secondary | ICD-10-CM | POA: Insufficient documentation

## 2019-07-04 LAB — POCT I-STAT CREATININE: Creatinine, Ser: 0.9 mg/dL (ref 0.61–1.24)

## 2019-07-04 MED ORDER — IOHEXOL 300 MG/ML  SOLN
125.0000 mL | Freq: Once | INTRAMUSCULAR | Status: AC | PRN
  Administered 2019-07-04: 125 mL via INTRAVENOUS

## 2019-07-04 NOTE — Telephone Encounter (Signed)
Called pt informed him of the information below. Pt gave verbal understanding.  

## 2019-07-04 NOTE — Telephone Encounter (Signed)
-----   Message from Billey Co, MD sent at 07/04/2019 11:12 AM EDT ----- Nothing worrisome on CT for microscopic hematuria. Keep follow up as scheduled. He is not doing the cysto Floy Sabina, MD 07/04/2019

## 2019-07-19 ENCOUNTER — Other Ambulatory Visit: Payer: Self-pay

## 2019-07-19 ENCOUNTER — Ambulatory Visit: Payer: Medicare PPO | Admitting: Urology

## 2019-07-19 ENCOUNTER — Encounter: Payer: Self-pay | Admitting: Urology

## 2019-07-19 DIAGNOSIS — R319 Hematuria, unspecified: Secondary | ICD-10-CM | POA: Diagnosis not present

## 2019-07-19 DIAGNOSIS — Z125 Encounter for screening for malignant neoplasm of prostate: Secondary | ICD-10-CM

## 2019-07-19 NOTE — Patient Instructions (Addendum)
Prostate Cancer Screening  Prostate cancer screening is a test that is done to check for the presence of prostate cancer in men. The prostate gland is a walnut-sized gland that is located below the bladder and in front of the rectum in males. The function of the prostate is to add fluid to semen during ejaculation. Prostate cancer is the second most common type of cancer in men. Who should have prostate cancer screening?  Screening recommendations vary based on age and other risk factors. Screening is recommended if:  You are older than age 55. If you are age 55-69, talk with your health care provider about your need for screening and how often screening should be done. Because most prostate cancers are slow growing and will not cause death, screening is generally reserved in this age group for men who have a 10-15-year life expectancy.  You are younger than age 55, and you have these risk factors: ? Being a black male or a male of African descent. ? Having a father, brother, or uncle who has been diagnosed with prostate cancer. The risk is higher if your family member's cancer occurred at an early age. Screening is not recommended if:  You are younger than age 40.  You are between the ages of 40 and 54 and you have no risk factors.  You are 70 years of age or older. At this age, the risks that screening can cause are greater than the benefits that it may provide. If you are at high risk for prostate cancer, your health care provider may recommend that you have screenings more often or that you start screening at a younger age. How is screening for prostate cancer done? The recommended prostate cancer screening test is a blood test called the prostate-specific antigen (PSA) test. PSA is a protein that is made in the prostate. As you age, your prostate naturally produces more PSA. Abnormally high PSA levels may be caused by:  Prostate cancer.  An enlarged prostate that is not caused by cancer  (benign prostatic hyperplasia, BPH). This condition is very common in older men.  A prostate gland infection (prostatitis). Depending on the PSA results, you may need more tests, such as:  A physical exam to check the size of your prostate gland.  Blood and imaging tests.  A procedure to remove tissue samples from your prostate gland for testing (biopsy). What are the benefits of prostate cancer screening?  Screening can help to identify cancer at an early stage, before symptoms start and when the cancer can be treated more easily.  There is a small chance that screening may lower your risk of dying from prostate cancer. The chance is small because prostate cancer is a slow-growing cancer, and most men with prostate cancer die from a different cause. What are the risks of prostate cancer screening? The main risk of prostate cancer screening is diagnosing and treating prostate cancer that would never have caused any symptoms or problems. This is called overdiagnosisand overtreatment. PSA screening cannot tell you if your PSA is high due to cancer or a different cause. A prostate biopsy is the only procedure to diagnose prostate cancer. Even the results of a biopsy may not tell you if your cancer needs to be treated. Slow-growing prostate cancer may not need any treatment other than monitoring, so diagnosing and treating it may cause unnecessary stress or other side effects. A prostate biopsy may also cause:  Infection or fever.  A false negative. This is   a result that shows that you do not have prostate cancer when you actually do have prostate cancer. Questions to ask your health care provider  When should I start prostate cancer screening?  What is my risk for prostate cancer?  How often do I need screening?  What type of screening tests do I need?  How do I get my test results?  What do my results mean?  Do I need treatment? Where to find more information  The American Cancer  Society: www.cancer.org  American Urological Association: www.auanet.org Contact a health care provider if:  You have difficulty urinating.  You have pain when you urinate or ejaculate.  You have blood in your urine or semen.  You have pain in your back or in the area of your prostate. Summary  Prostate cancer is a common type of cancer in men. The prostate gland is located below the bladder and in front of the rectum. This gland adds fluid to semen during ejaculation.  Prostate cancer screening may identify cancer at an early stage, when the cancer can be treated more easily.  The prostate-specific antigen (PSA) test is the recommended screening test for prostate cancer.  Discuss the risks and benefits of prostate cancer screening with your health care provider. If you are age 70 or older, the risks that screening can cause are greater than the benefits that it may provide. This information is not intended to replace advice given to you by your health care provider. Make sure you discuss any questions you have with your health care provider. Document Revised: 09/01/2018 Document Reviewed: 09/01/2018 Elsevier Patient Education  2020 Else[employer] Inc.  Tadalafil tablets (Cialis) What is this medicine? TADALAFIL (tah DA la fil) is used to treat erection problems in men. It is also used for enlargement of the prostate gland in men, a condition called benign prostatic hyperplasia or BPH. This medicine improves urine flow and reduces BPH symptoms. This medicine can also treat both erection problems and BPH when they occur together. This medicine may be used for other purposes; ask your health care provider or pharmacist if you have questions. COMMON BRAND NAME(S): Adcirca, ALYQ, Cialis What should I tell my health care provider before I take this medicine? They need to know if you have any of these conditions:  bleeding disorders  eye or vision problems, including a rare inherited eye  disease called retinitis pigmentosa  anatomical deformation of the penis, Peyronie's disease, or history of priapism (painful and prolonged erection)  heart disease, angina, a history of heart attack, irregular heart beats, or other heart problems  high or low blood pressure  history of blood diseases, like sickle cell anemia or leukemia  history of stomach bleeding  kidney disease  liver disease  stroke  an unusual or allergic reaction to tadalafil, other medicines, foods, dyes, or preservatives  pregnant or trying to get pregnant  breast-feeding How should I use this medicine? Take this medicine by mouth with a glass of water. Follow the directions on the prescription label. You may take this medicine with or without meals. When this medicine is used for erection problems, your doctor may prescribe it to be taken once daily or as needed. If you are taking the medicine as needed, you may be able to have sexual activity 30 minutes after taking it and for up to 36 hours after taking it. Whether you are taking the medicine as needed or once daily, you should not take more than one dose   per day. If you are taking this medicine for symptoms of benign prostatic hyperplasia (BPH) or to treat both BPH and an erection problem, take the dose once daily at about the same time each day. Do not take your medicine more often than directed. Talk to your pediatrician regarding the use of this medicine in children. Special care may be needed. Overdosage: If you think you have taken too much of this medicine contact a poison control center or emergency room at once. NOTE: This medicine is only for you. Do not share this medicine with others. What if I miss a dose? If you are taking this medicine as needed for erection problems, this does not apply. If you miss a dose while taking this medicine once daily for an erection problem, benign prostatic hyperplasia, or both, take it as soon as you remember, but  do not take more than one dose per day. What may interact with this medicine? Do not take this medicine with any of the following medications:  nitrates like amyl nitrite, isosorbide dinitrate, isosorbide mononitrate, nitroglycerin  other medicines for erectile dysfunction like avanafil, sildenafil, vardenafil  other tadalafil products (Adcirca)  riociguat This medicine may also interact with the following medications:  certain drugs for high blood pressure  certain drugs for the treatment of HIV infection or AIDS  certain drugs used for fungal or yeast infections, like fluconazole, itraconazole, ketoconazole, and voriconazole  certain drugs used for seizures like carbamazepine, phenytoin, and phenobarbital  grapefruit juice  macrolide antibiotics like clarithromycin, erythromycin, troleandomycin  medicines for prostate problems  rifabutin, rifampin or rifapentine This list may not describe all possible interactions. Give your health care provider a list of all the medicines, herbs, non-prescription drugs, or dietary supplements you use. Also tell them if you smoke, drink alcohol, or use illegal drugs. Some items may interact with your medicine. What should I watch for while using this medicine? If you notice any changes in your vision while taking this drug, call your doctor or health care professional as soon as possible. Stop using this medicine and call your health care provider right away if you have a loss of sight in one or both eyes. Contact your doctor or health care professional right away if the erection lasts longer than 4 hours or if it becomes painful. This may be a sign of serious problem and must be treated right away to prevent permanent damage. If you experience symptoms of nausea, dizziness, chest pain or arm pain upon initiation of sexual activity after taking this medicine, you should refrain from further activity and call your doctor or health care professional  as soon as possible. Do not drink alcohol to excess (examples, 5 glasses of wine or 5 shots of whiskey) when taking this medicine. When taken in excess, alcohol can increase your chances of getting a headache or getting dizzy, increasing your heart rate or lowering your blood pressure. Using this medicine does not protect you or your partner against HIV infection (the virus that causes AIDS) or other sexually transmitted diseases. What side effects may I notice from receiving this medicine? Side effects that you should report to your doctor or health care professional as soon as possible:  allergic reactions like skin rash, itching or hives, swelling of the face, lips, or tongue  breathing problems  changes in hearing  changes in vision  chest pain  fast, irregular heartbeat  prolonged or painful erection  seizures Side effects that usually do not require medical   attention (report to your doctor or health care professional if they continue or are bothersome):  back pain  dizziness  flushing  headache  indigestion  muscle aches  nausea  stuffy or runny nose This list may not describe all possible side effects. Call your doctor for medical advice about side effects. You may report side effects to FDA at 1-800-FDA-1088. Where should I keep my medicine? Keep out of the reach of children. Store at room temperature between 15 and 30 degrees C (59 and 86 degrees F). Throw away any unused medicine after the expiration date. NOTE: This sheet is a summary. It may not cover all possible information. If you have questions about this medicine, talk to your doctor, pharmacist, or health care provider.  2020 Elsevier/Gold Standard (2013-06-09 13:15:49)   

## 2019-07-19 NOTE — Progress Notes (Signed)
   07/19/2019 10:48 AM   Tobe Sos 05/04/1950 [id number]  Reason for visit: Follow up hematuria, ED, PSA screening  HPI: I saw Mr. Gayler back in urology clinic for the above issues.  He is a 69 year old male with a long history of microscopic hematuria and multiple negative work-ups previously.  He had one episode of gross hematuria in the last year, persistent microscopic hematuria, and using shared decision-making he opted for CT urogram but deferring the cystoscopy.  CT urogram on 07/04/2019 was completely benign with no suspicious findings.  We again discussed today the low, but not 0, risk of having a bladder malignancy detected by deferring cystoscopy.  He also mentioned erectile dysfunction at his last visit and has had good results with the 10 mg Cialis on demand.  We reviewed the risks and benefits of this medication again at length, as well as options for taking it as needed versus every other day.  Finally, regarding PSA screening, most recent PSA was 0.93 in December 2020, well within the normal range.  DRE's have been benign in the past.  We reviewed the AUA guidelines that do not recommend routine PSA screening in men over age 34, and we discussed the risks and benefits of screening at length.  He would like to repeat another PSA in a year, and then reconsider discontinuing screening.  RTC 1 year with PSA prior Continue Cialis for ED  Billey Co, MD  [facility] [address] [place], [place] [address] [phone]

## 2019-07-24 LAB — URINALYSIS, COMPLETE
Bilirubin, UA: NEGATIVE
Glucose, UA: NEGATIVE
Ketones, UA: NEGATIVE
Leukocytes,UA: NEGATIVE
Nitrite, UA: NEGATIVE
Protein,UA: NEGATIVE
Specific Gravity, UA: 1.015 (ref 1.005–1.030)
Urobilinogen, Ur: 0.2 mg/dL (ref 0.2–1.0)
pH, UA: 5 (ref 5.0–7.5)

## 2019-07-24 LAB — MICROSCOPIC EXAMINATION
Bacteria, UA: NONE SEEN
Epithelial Cells (non renal): NONE SEEN /hpf (ref 0–10)

## 2019-09-27 ENCOUNTER — Encounter: Payer: Self-pay | Admitting: Dermatology

## 2019-09-27 ENCOUNTER — Ambulatory Visit: Payer: Medicare PPO | Admitting: Dermatology

## 2019-09-27 ENCOUNTER — Other Ambulatory Visit: Payer: Self-pay

## 2019-09-27 DIAGNOSIS — L578 Other skin changes due to chronic exposure to nonionizing radiation: Secondary | ICD-10-CM

## 2019-09-27 DIAGNOSIS — L821 Other seborrheic keratosis: Secondary | ICD-10-CM

## 2019-09-27 DIAGNOSIS — D1800 Hemangioma unspecified site: Secondary | ICD-10-CM

## 2019-09-27 DIAGNOSIS — L738 Other specified follicular disorders: Secondary | ICD-10-CM

## 2019-09-27 DIAGNOSIS — D485 Neoplasm of uncertain behavior of skin: Secondary | ICD-10-CM | POA: Diagnosis not present

## 2019-09-27 DIAGNOSIS — Z1283 Encounter for screening for malignant neoplasm of skin: Secondary | ICD-10-CM | POA: Diagnosis not present

## 2019-09-27 DIAGNOSIS — B351 Tinea unguium: Secondary | ICD-10-CM

## 2019-09-27 DIAGNOSIS — D229 Melanocytic nevi, unspecified: Secondary | ICD-10-CM | POA: Diagnosis not present

## 2019-09-27 DIAGNOSIS — D239 Other benign neoplasm of skin, unspecified: Secondary | ICD-10-CM

## 2019-09-27 DIAGNOSIS — B353 Tinea pedis: Secondary | ICD-10-CM

## 2019-09-27 DIAGNOSIS — D489 Neoplasm of uncertain behavior, unspecified: Secondary | ICD-10-CM

## 2019-09-27 DIAGNOSIS — L814 Other melanin hyperpigmentation: Secondary | ICD-10-CM

## 2019-09-27 HISTORY — DX: Other benign neoplasm of skin, unspecified: D23.9

## 2019-09-27 NOTE — Patient Instructions (Addendum)
Recommend daily broad spectrum sunscreen SPF 30+ to sun-exposed areas, reapply every 2 hours as needed. Call for new or changing lesions.   Onychomycosis: start over the counter Vick's Vapor Rub.  Athletes foot start over the counter Lamisil cream to nails and all over foot  Recommend taking Heliocare sun protection supplement daily in sunny weather for additional sun protection. For maximum protection on the sunniest days, you can take up to 2 capsules of regular Heliocare OR take 1 capsule of Heliocare Ultra. For prolonged exposure (such as a full day in the sun), you can repeat your dose of the supplement 4 hours after your first dose. Heliocare can be purchased at [facility] or at [email or web address].    Wound Care Instructions  1. Cleanse wound gently with soap and water once a day then pat dry with clean gauze. Apply a thing coat of Petrolatum (petroleum jelly, "Vaseline") over the wound (unless you have an allergy to this). We recommend that you use a new, sterile tube of Vaseline. Do not pick or remove scabs. Do not remove the yellow or white "healing tissue" from the base of the wound.  2. Cover the wound with fresh, clean, nonstick gauze and secure with paper tape. You may use Band-Aids in place of gauze and tape if the would is small enough, but would recommend trimming much of the tape off as there is often too much. Sometimes Band-Aids can irritate the skin.  3. You should call the office for your biopsy report after 1 week if you have not already been contacted.  4. If you experience any problems, such as abnormal amounts of bleeding, swelling, significant bruising, significant pain, or evidence of infection, please call the office immediately.  5. FOR ADULT SURGERY PATIENTS: If you need something for pain relief you may take 1 extra strength Tylenol (acetaminophen) AND 2 Ibuprofen (200mg  each) together every 4 hours as needed for pain. (do not take these if you are allergic  to them or if you have a reason you should not take them.) Typically, you may only need pain medication for 1 to 3 days.

## 2019-09-27 NOTE — Progress Notes (Signed)
Follow-Up Visit   Subjective  Gary Escobar is a 69 y.o. male who presents for the following: full body skin exam and skin cancer screening  Patient presents today for annual TBSE, has some areas of concern on his back, left leg, and behind right ear. Patient does not have a h/o skin cancer. He has chronically thickened toenails.   The following portions of the chart were reviewed this encounter and updated as appropriate:  Tobacco   Allergies   Meds   Problems   Med Hx   Surg Hx   Fam Hx       Review of Systems:  No other skin or systemic complaints except as noted in HPI or Assessment and Plan.  Objective  Well appearing patient in no apparent distress; mood and affect are within normal limits.  A full examination was performed including scalp, head, eyes, ears, nose, lips, neck, chest, axillae, abdomen, back, buttocks, bilateral upper extremities, bilateral lower extremities, hands, feet, fingers, toes, fingernails, and toenails. All findings within normal limits unless otherwise noted below.  Objective  Right Upper Back: 0.8 cm irregular dark and light brown waxy papule       Objective  Left Lateral Thigh: 0.3 cm irregular dark brown thin papule       Objective  multiple toenails: Nail dystrophy  Objective  B/L foot: Scaling and maceration web spaces and over distal and lateral soles.    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  Skin / nail biopsy Type of biopsy: tangential   Informed consent: discussed and consent obtained   Timeout: patient name, date of birth, surgical site, and procedure verified   Procedure prep:  Patient was prepped and draped in usual sterile fashion Prep type:  Isopropyl alcohol Anesthesia: the lesion was anesthetized in a standard fashion   Anesthetic:  1% lidocaine w/ epinephrine 1-100,000 buffered w/ 8.4% NaHCO3 Instrument used: flexible razor blade   Hemostasis achieved with: aluminum chloride    Outcome: patient tolerated procedure well   Post-procedure details: sterile dressing applied and wound care instructions given   Dressing type: bandage (mupirocin)    Specimen 1 - Surgical pathology Differential Diagnosis: SK vs collision of sk and atypical nevus Check Margins: No 0.8 cm irregular dark and light brown waxy papule  Left Lateral Thigh  Epidermal / dermal shaving  Lesion diameter (cm):  0.5 Informed consent: discussed and consent obtained   Timeout: patient name, date of birth, surgical site, and procedure verified   Anesthesia: the lesion was anesthetized in a standard fashion   Anesthetic:  1% lidocaine w/ epinephrine 1-100,000 buffered w/ 8.4% NaHCO3 Instrument used: flexible razor blade   Outcome: patient tolerated procedure well   Post-procedure details: sterile dressing applied and wound care instructions given   Dressing type: petrolatum and pressure dressing    Specimen 2 - Surgical pathology Differential Diagnosis: R/O Atypia Check Margins: No  Onychomycosis multiple toenails  Chronic. Discussed therapy options including pulse PO terbinafine and topical rx products. Deferred at this time.    Start OTC Vick's vapor Rub   Tinea pedis of right foot B/L foot  Apply OTC terbinafine twice daily to toes and all over foot   Lentigines - Scattered tan macules - Discussed due to sun exposure - Benign, observe - Call for any changes  Seborrheic Keratoses - Stuck-on, waxy, tan-brown papules and plaques  - Discussed benign etiology and prognosis. - Observe - Call for any changes  Melanocytic Nevi - Tan-brown and/or pink-flesh-colored symmetric macules and papules - Benign appearing on exam today - Observation - Call clinic for new or changing moles - Recommend daily use of broad spectrum spf 30+ sunscreen to sun-exposed areas.   Hemangiomas - Red papules - Discussed benign nature - Observe - Call for any changes  Actinic Damage - diffuse  scaly erythematous macules with underlying dyspigmentation - Recommend daily broad spectrum sunscreen SPF 30+ to sun-exposed areas, reapply every 2 hours as needed.  - Call for new or changing lesions.  Sebaceous Hyperplasia - Small yellow papules with a central dell - Benign - Observe   Skin cancer screening performed today.   Return in about 1 year (around 09/26/2020) for TBSE.  I, Donzetta Kohut, CMA, am acting as scribe for Forest Gleason, MD .  Documentation: I have reviewed the above documentation for accuracy and completeness, and I agree with the above.  Forest Gleason, MD

## 2019-09-29 NOTE — Progress Notes (Signed)
1. Skin , (A) right upper back PIGMENTED SEBORRHEIC KERATOSIS  This is a benign growth or "wisdom spot". No additional treatment is needed.   2. Skin , (B) left lateral thigh DYSPLASTIC JUNCTIONAL LENTIGINOUS NEVUS WITH MODERATE ATYPIA, IRRITATED, LIMITED MARGINS FREE  This is a MODERATELY ATYPICAL MOLE. On the spectrum from normal mole to melanoma skin cancer, this is in between the two. - We need to recheck this area sometime in the next 6 months to be sure there is no evidence of the atypical mole coming back. If there is any color coming back, we would recommend repeating the biopsy to be sure the cells look normal.  - People who have a history of atypical moles do have a slightly increased risk of developing melanoma somewhere on the body, so a yearly full body skin exam by a dermatologist is recommended.  - Monthly self skin checks and daily sun protection are also recommended.  - Please call if you notice a dark spot coming back where this biopsy was taken.  - Please also call if you notice any new or changing spots anywhere else on the body before your follow-up visit.    MAs please call

## 2019-10-03 ENCOUNTER — Encounter: Payer: Self-pay | Admitting: Dermatology

## 2019-10-03 ENCOUNTER — Telehealth: Payer: Self-pay

## 2019-10-03 NOTE — Telephone Encounter (Signed)
Left patient message advising him to call with any questions concerning his biopsy results. Also sent him a message via MyChart to contact the office so that we can get him scheduled for a 6 month follow up, JS

## 2019-10-03 NOTE — Telephone Encounter (Signed)
-----   Message from Alfonso Patten, MD sent at 09/29/2019  5:43 PM EDT ----- 1. Skin , (A) right upper back PIGMENTED SEBORRHEIC KERATOSIS  This is a benign growth or "wisdom spot". No additional treatment is needed.   2. Skin , (B) left lateral thigh DYSPLASTIC JUNCTIONAL LENTIGINOUS NEVUS WITH MODERATE ATYPIA, IRRITATED, LIMITED MARGINS FREE  This is a MODERATELY ATYPICAL MOLE. On the spectrum from normal mole to melanoma skin cancer, this is in between the two. - We need to recheck this area sometime in the next 6 months to be sure there is no evidence of the atypical mole coming back. If there is any color coming back, we would recommend repeating the biopsy to be sure the cells look normal.  - People who have a history of atypical moles do have a slightly increased risk of developing melanoma somewhere on the body, so a yearly full body skin exam by a dermatologist is recommended.  - Monthly self skin checks and daily sun protection are also recommended.  - Please call if you notice a dark spot coming back where this biopsy was taken.  - Please also call if you notice any new or changing spots anywhere else on the body before your follow-up visit.    MAs please call

## 2019-12-12 NOTE — Patient Instructions (Addendum)
DUE TO COVID-19 ONLY ONE VISITOR IS ALLOWED TO COME WITH YOU AND STAY IN THE WAITING ROOM ONLY DURING PRE OP AND PROCEDURE DAY OF SURGERY. THE 1 VISITOR  MAY VISIT WITH YOU AFTER SURGERY IN YOUR PRIVATE ROOM DURING VISITING HOURS ONLY!  YOU NEED TO HAVE A COVID 19 TEST ON_11/13______ @_11 :25______, THIS TEST MUST BE DONE BEFORE SURGERY,  COVID TESTING SITE [address] [place] [address], IT IS ON THE RIGHT GOING OUT [address] APPROXIMATELY  2 MINUTES PAST [employer] ON THE RIGHT. ONCE YOUR COVID TEST IS COMPLETED,  PLEASE BEGIN THE QUARANTINE INSTRUCTIONS AS OUTLINED IN YOUR HANDOUT.                Gary Escobar    Your procedure is scheduled on: 12/20/19   Report to [facility] Main  Entrance   Report to admitting at   7:55 AM     Call this number if you have problems the morning of surgery [place], NO CHEWING GUM [place].   No food after midnight.    You may have clear liquid until 7:00 AM.    At 7:00 AM drink pre surgery drink  . Nothing by mouth after 7:00 AM.    Take these medicines the morning of surgery with A SIP OF WATER: Diltiazem                                 You may not have any metal on your body including hair pins and              piercings  Do not wear jewelry,  lotions, powders or deodorant                        Men may shave face and neck.   Do not bring valuables to the hospital. [place].  Contacts, dentures or bridgework may not be worn into surgery.       Special Instructions: N/A              Please read over the following fact sheets you were given: _____________________________________________________________________             [facility] - Preparing for Surgery  Before surgery, you can play an important role.   Because skin is not sterile, your skin needs to be as free of  germs as possible .  You can reduce the number of germs on your skin by washing with CHG (chlorahexidine gluconate) soap before surgery.   CHG is an antiseptic cleaner which kills germs and bonds with the skin to continue killing germs even after washing. Please DO NOT use if you have an allergy to CHG or antibacterial soaps.   If your skin becomes reddened/irritated stop using the CHG and inform your nurse when you arrive at Short Stay. .  You may shave your face/neck.  Please follow these instructions carefully:  1.  Shower with CHG Soap the night before surgery and the  morning of Surgery.  2.  If you choose to wash your hair, wash your hair first as usual with your  normal  shampoo.  3.  After you shampoo, rinse  your hair and body thoroughly to remove the  shampoo.                                        4.  Use CHG as you would any other liquid soap.  You can apply chg directly  to the skin and wash                       Gently with a scrungie or clean washcloth.  5.  Apply the CHG Soap to your body ONLY FROM THE NECK DOWN.   Do not use on face/ open                           Wound or open sores. Avoid contact with eyes, ears mouth and genitals (private parts).                       Wash face,  Genitals (private parts) with your normal soap.             6.  Wash thoroughly, paying special attention to the area where your surgery  will be performed.  7.  Thoroughly rinse your body with warm water from the neck down.  8.  DO NOT shower/wash with your normal soap after using and rinsing off  the CHG Soap.             9.  Pat yourself dry with a clean towel.            10.  Wear clean pajamas.            11.  Place clean sheets on your bed the night of your first shower and do not  sleep with pets. Day of Surgery : Do not apply any lotions/deodorants the morning of surgery.  Please wear clean clothes to the hospital/surgery center.  FAILURE TO FOLLOW THESE INSTRUCTIONS MAY RESULT IN THE  CANCELLATION OF YOUR SURGERY PATIENT SIGNATURE_________________________________  NURSE SIGNATURE__________________________________  ________________________________________________________________________   Gary Escobar  An incentive spirometer is a tool that can help keep your lungs clear and active. This tool measures how well you are filling your lungs with each breath. Taking long deep breaths may help reverse or decrease the chance of developing breathing (pulmonary) problems (especially infection) following:  A long period of time when you are unable to move or be active. BEFORE THE PROCEDURE   If the spirometer includes an indicator to show your best effort, your nurse or respiratory therapist will set it to a desired goal.  If possible, sit up straight or lean slightly forward. Try not to slouch.  Hold the incentive spirometer in an upright position. INSTRUCTIONS FOR USE  1. Sit on the edge of your bed if possible, or sit up as far as you can in bed or on a chair. 2. Hold the incentive spirometer in an upright position. 3. Breathe out normally. 4. Place the mouthpiece in your mouth and seal your lips tightly around it. 5. Breathe in slowly and as deeply as possible, raising the piston or the ball toward the top of the column. 6. Hold your breath for 3-5 seconds or for as long as possible. Allow the piston or ball to fall to the bottom of the column. 7. Remove the mouthpiece from your mouth and breathe  out normally. 8. Rest for a few seconds and repeat Steps 1 through 7 at least 10 times every 1-2 hours when you are awake. Take your time and take a few normal breaths between deep breaths. 9. The spirometer may include an indicator to show your best effort. Use the indicator as a goal to work toward during each repetition. 10. After each set of 10 deep breaths, practice coughing to be sure your lungs are clear. If you have an incision (the cut made at the time of surgery),  support your incision when coughing by placing a pillow or rolled up towels firmly against it. Once you are able to get out of bed, walk around indoors and cough well. You may stop using the incentive spirometer when instructed by your caregiver.  RISKS AND COMPLICATIONS  Take your time so you do not get dizzy or light-headed.  If you are in pain, you may need to take or ask for pain medication before doing incentive spirometry. It is harder to take a deep breath if you are having pain. AFTER USE  Rest and breathe slowly and easily.  It can be helpful to keep track of a log of your progress. Your caregiver can provide you with a simple table to help with this. If you are using the spirometer at home, follow these instructions: [place] IF:   You are having difficultly using the spirometer.  You have trouble using the spirometer as often as instructed.  Your pain medication is not giving enough relief while using the spirometer.  You develop fever of 100.5 F (38.1 C) or higher. SEEK IMMEDIATE MEDICAL CARE IF:   You cough up bloody sputum that had not been present before.  You develop fever of 102 F (38.9 C) or greater.  You develop worsening pain at or near the incision site. MAKE SURE YOU:   Understand these instructions.  Will watch your condition.  Will get help right away if you are not doing well or get worse. Document Released: 06/01/2006 Document Revised: 04/13/2011 Document Reviewed: 08/02/2006 [facility] Patient Information 2014 [place], [place].   ________________________________________________________________________

## 2019-12-13 ENCOUNTER — Encounter (HOSPITAL_COMMUNITY): Payer: Self-pay

## 2019-12-13 ENCOUNTER — Other Ambulatory Visit: Payer: Self-pay

## 2019-12-13 ENCOUNTER — Encounter (HOSPITAL_COMMUNITY)
Admission: RE | Admit: 2019-12-13 | Discharge: 2019-12-13 | Disposition: A | Discharge status: Home / Self Care | Payer: Medicare PPO | Source: Ambulatory Visit | Attending: Orthopedic Surgery | Admitting: Orthopedic Surgery

## 2019-12-13 DIAGNOSIS — Z01818 Encounter for other preprocedural examination: Secondary | ICD-10-CM | POA: Diagnosis not present

## 2019-12-13 DIAGNOSIS — I4891 Unspecified atrial fibrillation: Secondary | ICD-10-CM | POA: Diagnosis not present

## 2019-12-13 HISTORY — DX: Unspecified osteoarthritis, unspecified site: M19.90

## 2019-12-13 HISTORY — DX: Essential (primary) hypertension: I10

## 2019-12-13 LAB — CBC
HCT: 47.5 % (ref 39.0–52.0)
Hemoglobin: 16.2 g/dL (ref 13.0–17.0)
MCH: 30.9 pg (ref 26.0–34.0)
MCHC: 34.1 g/dL (ref 30.0–36.0)
MCV: 90.6 fL (ref 80.0–100.0)
Platelets: 242 10*3/uL (ref 150–400)
RBC: 5.24 MIL/uL (ref 4.22–5.81)
RDW: 13 % (ref 11.5–15.5)
WBC: 9.3 10*3/uL (ref 4.0–10.5)
nRBC: 0 % (ref 0.0–0.2)

## 2019-12-13 LAB — PROTIME-INR
INR: 1.3 — ABNORMAL HIGH (ref 0.8–1.2)
Prothrombin Time: 15.2 seconds (ref 11.4–15.2)

## 2019-12-13 LAB — SURGICAL PCR SCREEN
MRSA, PCR: NEGATIVE
Staphylococcus aureus: POSITIVE — AB

## 2019-12-13 LAB — COMPREHENSIVE METABOLIC PANEL
ALT: 24 U/L (ref 0–44)
AST: 20 U/L (ref 15–41)
Albumin: 4.5 g/dL (ref 3.5–5.0)
Alkaline Phosphatase: 78 U/L (ref 38–126)
Anion gap: 9 (ref 5–15)
BUN: 16 mg/dL (ref 8–23)
CO2: 25 mmol/L (ref 22–32)
Calcium: 9.3 mg/dL (ref 8.9–10.3)
Chloride: 107 mmol/L (ref 98–111)
Creatinine, Ser: 0.88 mg/dL (ref 0.61–1.24)
GFR, Estimated: >60 mL/min (ref >60)
Glucose, Bld: 100 mg/dL — ABNORMAL HIGH (ref 70–99)
Potassium: 4.6 mmol/L (ref 3.5–5.1)
Sodium: 141 mmol/L (ref 135–145)
Total Bilirubin: 0.8 mg/dL (ref 0.3–1.2)
Total Protein: 7.8 g/dL (ref 6.5–8.1)

## 2019-12-13 LAB — APTT: aPTT: 34 seconds (ref 24–36)

## 2019-12-13 NOTE — Progress Notes (Signed)
COVID Vaccine Completed:Yes Date COVID Vaccine completed:06/02/19 COVID vaccine manufacturer: Pfizer      PCP - Dr. Roetta Sessions Cardiologist - Dr. Raliegh Ip. Fath  Chest x-ray - no EKG - 12/13/19-Epic-chart Stress Test -  ECHO - 02/07/18- Care everywhere-[facility] Cardiac Cath - no Pacemaker/ICD device last checked:NA  Sleep Study - yes-mild CPAP - no  Fasting Blood Sugar - NA Checks Blood Sugar _____ times a day  Blood Thinner Instructions:Eliquis/ Dr. Ubaldo Glassing Aspirin Instructions:Stop 3 days prior/ [place] Last Dose:11/13  Anesthesia review:   Patient denies shortness of breath, fever, cough and chest pain at PAT appointment yes   Patient verbalized understanding of instructions that were given to them at the PAT appointment. Patient was also instructed that they will need to review over the PAT instructions again at home before surgery.yes  Pt had no SOB climbing stairs because he takes a long time because of pain. No SOB mowing the lawn or with ADLs

## 2019-12-14 NOTE — Progress Notes (Signed)
Anesthesia Chart Review   Case: [id number] Date/Time: 12/20/19 1009   Procedure: TOTAL HIP ARTHROPLASTY ANTERIOR APPROACH (Left Hip) - 143min   Anesthesia type: Choice   Pre-op diagnosis: Left hip osteoarthritis   Location: [facility] ROOM 09 / [facility]   Surgeons: Gaynelle Arabian, MD      DISCUSSION:69 y.o. never smoker with h/o HTN, a-fib (on Eliquis), left hip OA scheduled for above procedure 12/20/19 with Dr. Gaynelle Arabian.   Pt last seen by cardiology 11/27/19.  Per OV note, "Patient has a history of atrial fibrillation/flutter. He has been controlled with diltiazem and anticoagulated with Eliquis. He is doing well from a cardiac standpoint without any chest pain syncope or shortness of breath. He has no excessive bleeding issues. He is scheduled for hip surgery November 17. We discussed stopping Eliquis prior to the procedure. Typically this is under the direction of the surgeon however stopping Eliquis 3 to 2 days prior to the procedure would be acceptable. Would recommend continue with diltiazem throughout the perioperative period. Echocardiogram has shown preserved LV function with no significant valvular abnormalities. Patient has no rest or exertional chest pain."  Anticipate pt can proceed with planned procedure barring acute status change.   VS: BP 134/84   Pulse 80   Temp 37 C (Oral)   Resp 18   Ht 5\' 8"  (1.727 m)   Wt 95.5 kg   SpO2 100%   BMI 32.01 kg/m   PROVIDERS: Tracie Harrier, MD is PCP   Bartholome Bill, MD is Cardiologist  LABS: Labs reviewed: Acceptable for surgery. (all labs ordered are listed, but only abnormal results are displayed)  Labs Reviewed  SURGICAL PCR SCREEN - Abnormal; Notable for the following components:      Result Value   Staphylococcus aureus POSITIVE (*)    All other components within normal limits  COMPREHENSIVE METABOLIC PANEL - Abnormal; Notable for the following components:   Glucose, Bld 100 (*)    All other components within normal limits   PROTIME-INR - Abnormal; Notable for the following components:   INR 1.3 (*)    All other components within normal limits  APTT  CBC  TYPE AND SCREEN     IMAGES:   EKG: 12/13/19 Rate 94 bpm  Atrial fibrillation   CV: Echo 02/07/2018 INTERPRETATION  NORMAL LEFT VENTRICULAR SYSTOLIC FUNCTION  NORMAL RIGHT VENTRICULAR SYSTOLIC FUNCTION  MILD VALVULAR REGURGITATION (See above)  NO VALVULAR STENOSIS  Closest EF: >55% (Estimated)  Mitral: MILD MR  Tricuspid: MILD TR   Past Medical History:  Diagnosis Date  . Arthritis    hip,   . Borderline hypertension   . Dysrhythmia 2019  . History of kidney stones 2001  . Hyperlipidemia   . Hypertension   . Positive cardiac stress test     Past Surgical History:  Procedure Laterality Date  . COLONOSCOPY    . COLONOSCOPY WITH PROPOFOL N/A 11/08/2014   Procedure: COLONOSCOPY WITH PROPOFOL;  Surgeon: Manya Silvas, MD;  Location: [facility] ENDOSCOPY;  Service: Endoscopy;  Laterality: N/A;  . right knee arthroscopy Right     MEDICATIONS: . diltiazem (CARDIZEM CD) 240 MG 24 hr capsule  . ELIQUIS 5 MG TABS tablet  . Flaxseed, Linseed, (FLAXSEED OIL PO)  . Multiple Vitamin (MULTIVITAMIN) tablet  . Multiple Vitamins-Minerals (MEGA MULTI MEN PO)  . omega-3 acid ethyl esters (LOVAZA) 1 g capsule  . tadalafil (CIALIS) 5 MG tablet  . Turmeric 500 MG CAPS   No current facility-administered medications for this  encounter.    Konrad Felix, PA-C [facility] Pre-Surgical Testing [phone]

## 2019-12-16 ENCOUNTER — Other Ambulatory Visit (HOSPITAL_COMMUNITY)
Admission: RE | Admit: 2019-12-16 | Discharge: 2019-12-16 | Disposition: A | Discharge status: Home / Self Care | Payer: Medicare PPO | Source: Ambulatory Visit | Attending: Orthopedic Surgery | Admitting: Orthopedic Surgery

## 2019-12-16 DIAGNOSIS — Z20822 Contact with and (suspected) exposure to covid-19: Secondary | ICD-10-CM | POA: Diagnosis not present

## 2019-12-16 DIAGNOSIS — Z01812 Encounter for preprocedural laboratory examination: Secondary | ICD-10-CM | POA: Insufficient documentation

## 2019-12-16 LAB — SARS CORONAVIRUS 2 (TAT 6-24 HRS): SARS Coronavirus 2: NEGATIVE

## 2019-12-19 NOTE — H&P (Signed)
TOTAL HIP ADMISSION H&P  Patient is admitted for left total hip arthroplasty.  Subjective:  Chief Complaint: left hip pain  HPI: Gary Escobar, 69 y.o. male, has a history of pain and functional disability in the left hip(s) due to arthritis and patient has failed non-surgical conservative treatments for greater than 12 weeks to include NSAID's and/or analgesics and activity modification.  Onset of symptoms was gradual starting 2 years ago with gradually worsening course since that time.The patient noted no past surgery on the left hip(s).  Patient currently rates pain in the left hip at 9 out of 10 with activity. Patient has worsening of pain with activity and weight bearing and pain that interfers with activities of daily living. Patient has evidence of joint space narrowing by imaging studies. This condition presents safety issues increasing the risk of falls.  There is no current active infection.  Patient Active Problem List   Diagnosis Date Noted  . Primary osteoarthritis of left hip 10/22/2016  . Osteoarthritis of right knee 10/22/2016  . Leg length discrepancy 10/22/2016   Past Medical History:  Diagnosis Date  . Arthritis    hip,   . Borderline hypertension   . Dysrhythmia 2019  . History of kidney stones 2001  . Hyperlipidemia   . Hypertension   . Positive cardiac stress test     Past Surgical History:  Procedure Laterality Date  . COLONOSCOPY    . COLONOSCOPY WITH PROPOFOL N/A 11/08/2014   Procedure: COLONOSCOPY WITH PROPOFOL;  Surgeon: Manya Silvas, MD;  Location: [facility] ENDOSCOPY;  Service: Endoscopy;  Laterality: N/A;  . right knee arthroscopy Right     No current facility-administered medications for this encounter.   Current Outpatient Medications  Medication Sig Dispense Refill Last Dose  . diltiazem (CARDIZEM CD) 240 MG 24 hr capsule Take 240 mg by mouth daily.     Marland Kitchen ELIQUIS 5 MG TABS tablet Take 5 mg by mouth 2 (two) times daily.      . Flaxseed, Linseed,  (FLAXSEED OIL PO) Take by mouth 2 (two) times a week. Meal / Mix with yogurt     . Multiple Vitamin (MULTIVITAMIN) tablet Take 1 tablet by mouth daily. Gnc     . Multiple Vitamins-Minerals (MEGA MULTI MEN PO) Take 1 tablet by mouth daily.     Marland Kitchen omega-3 acid ethyl esters (LOVAZA) 1 g capsule Take 1 g by mouth daily.     . Turmeric 500 MG CAPS Take 500 mg by mouth daily.     . tadalafil (CIALIS) 5 MG tablet Take 1-2 tablets (5-10 mg total) by mouth daily as needed for erectile dysfunction. (Patient not taking: Reported on 12/08/2019) 30 tablet 11 Not Taking at Unknown time   No Known Allergies  Social History   Tobacco Use  . Smoking status: Never Smoker  . Smokeless tobacco: Never Used  Substance Use Topics  . Alcohol use: Yes    Comment: rare    No family history on file.   Review of Systems  Constitutional: Negative for chills and fever.  Respiratory: Negative for cough and shortness of breath.   Cardiovascular: Negative for chest pain.  Gastrointestinal: Negative for nausea and vomiting.  Musculoskeletal: Positive for arthralgias.    Objective:  Physical Exam Patient is a 70 year old male.  Well nourished and well developed. General: Alert and oriented x3, cooperative and pleasant, no acute distress. Head: normocephalic, atraumatic, neck supple. Eyes: EOMI. Respiratory: breath sounds clear in all fields, no wheezing, rales,  or rhonchi. Cardiovascular: Regular rate and rhythm, no murmurs, gallops or rubs. Abdomen: non-tender to palpation and soft, normoactive bowel sounds. Musculoskeletal: Left Hip Exam: The range of motion: Flexion to 90 degrees, No Internal Rotation, No External Rotation, and abduction to 10 degrees with significant pain on range of motion. The hip actually squeaks on range of motion. There is no tenderness over the greater trochanter bursa.  Calves soft and nontender. Motor function intact in LE. Strength 5/5 LE bilaterally. Neuro: Distal pulses 2+.  Sensation to light touch intact in LE.  Vital signs in last 24 hours:    Labs:   Estimated body mass index is 32.01 kg/m as calculated from the following:   Height as of 12/13/19: 5\' 8"  (1.727 m).   Weight as of 12/13/19: 95.5 kg.   Imaging Review Plain radiographs demonstrate severe degenerative joint disease of the left hip(s). The bone quality appears to be adequate for age and reported activity level.      Assessment/Plan:  End stage arthritis, left hip(s)  The patient history, physical examination, clinical judgement of the provider and imaging studies are consistent with end stage degenerative joint disease of the left hip(s) and total hip arthroplasty is deemed medically necessary. The treatment options including medical management, injection therapy, arthroscopy and arthroplasty were discussed at length. The risks and benefits of total hip arthroplasty were presented and reviewed. The risks due to aseptic loosening, infection, stiffness, dislocation/subluxation,  thromboembolic complications and other imponderables were discussed.  The patient acknowledged the explanation, agreed to proceed with the plan and consent was signed. Patient is being admitted for inpatient treatment for surgery, pain control, PT, OT, prophylactic antibiotics, VTE prophylaxis, progressive ambulation and ADL's and discharge planning.The patient is planning to be discharged home.   Therapy Plans: HHPT Disposition: Home with wife Planned DVT Prophylaxis: Eliquis 5 mg BID DME needed: walker, 3-n-1 PCP: Dr. Cherre Blanc, clearance received Cardiologist: Dr. Ubaldo Glassing, clearance received TXA: IV Allergies: possible allergy to aspirin - lip swelling, hives Anesthesia Concerns: none BMI: 33.5 Not diabetic.  Other: Hx of A fib/flutter, on Eliquis. Instructed patient on which medications to discontinue 5 days prior to surgery. Will follow-up in office with Dr. Wynelle Link 2 weeks post-op.   - Patient was  instructed on what medications to stop prior to surgery. - Follow-up visit in 2 weeks with Dr. Wynelle Link - Begin physical therapy following surgery - Pre-operative lab work as pre-surgical testing - Prescriptions will be provided in hospital at time of discharge  Griffith Citron, PA-C Orthopedic Surgery [facility] [place] [phone]

## 2019-12-20 ENCOUNTER — Ambulatory Visit (HOSPITAL_COMMUNITY): Payer: Medicare PPO | Admitting: Anesthesiology

## 2019-12-20 ENCOUNTER — Other Ambulatory Visit: Payer: Self-pay

## 2019-12-20 ENCOUNTER — Ambulatory Visit (HOSPITAL_COMMUNITY): Payer: Medicare PPO | Admitting: Physician Assistant

## 2019-12-20 ENCOUNTER — Telehealth (HOSPITAL_COMMUNITY): Payer: Self-pay | Admitting: *Deleted

## 2019-12-20 ENCOUNTER — Observation Stay (HOSPITAL_COMMUNITY)
Admission: RE | Admit: 2019-12-20 | Discharge: 2019-12-21 | Disposition: A | Discharge status: Home / Self Care | Payer: Medicare PPO | Attending: Orthopedic Surgery | Admitting: Orthopedic Surgery

## 2019-12-20 ENCOUNTER — Ambulatory Visit (HOSPITAL_COMMUNITY): Payer: Medicare PPO

## 2019-12-20 ENCOUNTER — Encounter (HOSPITAL_COMMUNITY)
Admission: RE | Disposition: A | Discharge status: Home / Self Care | Payer: Self-pay | Source: Home / Self Care | Attending: Orthopedic Surgery

## 2019-12-20 ENCOUNTER — Encounter (HOSPITAL_COMMUNITY): Payer: Self-pay | Admitting: Orthopedic Surgery

## 2019-12-20 DIAGNOSIS — M1711 Unilateral primary osteoarthritis, right knee: Secondary | ICD-10-CM

## 2019-12-20 DIAGNOSIS — M25552 Pain in left hip: Secondary | ICD-10-CM | POA: Diagnosis present

## 2019-12-20 DIAGNOSIS — Z79899 Other long term (current) drug therapy: Secondary | ICD-10-CM | POA: Insufficient documentation

## 2019-12-20 DIAGNOSIS — Z96649 Presence of unspecified artificial hip joint: Secondary | ICD-10-CM

## 2019-12-20 DIAGNOSIS — M1612 Unilateral primary osteoarthritis, left hip: Secondary | ICD-10-CM | POA: Diagnosis not present

## 2019-12-20 DIAGNOSIS — I10 Essential (primary) hypertension: Secondary | ICD-10-CM | POA: Insufficient documentation

## 2019-12-20 DIAGNOSIS — Z419 Encounter for procedure for purposes other than remedying health state, unspecified: Secondary | ICD-10-CM

## 2019-12-20 HISTORY — PX: TOTAL HIP ARTHROPLASTY: SHX124

## 2019-12-20 LAB — ABO/RH: ABO/RH(D): A POS

## 2019-12-20 LAB — TYPE AND SCREEN
ABO/RH(D): A POS
Antibody Screen: NEGATIVE

## 2019-12-20 SURGERY — ARTHROPLASTY, HIP, TOTAL, ANTERIOR APPROACH
Anesthesia: Spinal | Site: Hip | Laterality: Left

## 2019-12-20 MED ORDER — PROPOFOL 500 MG/50ML IV EMUL
INTRAVENOUS | Status: DC | PRN
  Administered 2019-12-20 (×2): 40 mg via INTRAVENOUS

## 2019-12-20 MED ORDER — PROPOFOL 10 MG/ML IV BOLUS
INTRAVENOUS | Status: AC
  Filled 2019-12-20: qty 20

## 2019-12-20 MED ORDER — DEXAMETHASONE SODIUM PHOSPHATE 10 MG/ML IJ SOLN: 8.0000 mg | Freq: Once | INTRAMUSCULAR | Status: DC

## 2019-12-20 MED ORDER — TRANEXAMIC ACID-NACL 1000-0.7 MG/100ML-% IV SOLN
1000.0000 mg | INTRAVENOUS | Status: DC
  Filled 2019-12-20: qty 100

## 2019-12-20 MED ORDER — CEFAZOLIN SODIUM-DEXTROSE 2-4 GM/100ML-% IV SOLN
2.0000 g | INTRAVENOUS | Status: AC
  Administered 2019-12-20: 2 g via INTRAVENOUS
  Filled 2019-12-20: qty 100

## 2019-12-20 MED ORDER — ORAL CARE MOUTH RINSE: 15.0000 mL | Freq: Once | OROMUCOSAL | Status: AC

## 2019-12-20 MED ORDER — SODIUM CHLORIDE 0.9 % IV SOLN: INTRAVENOUS | Status: DC

## 2019-12-20 MED ORDER — ONDANSETRON HCL 4 MG/2ML IJ SOLN
INTRAMUSCULAR | Status: AC
  Filled 2019-12-20: qty 2

## 2019-12-20 MED ORDER — METHOCARBAMOL 500 MG IVPB - SIMPLE MED
500.0000 mg | Freq: Four times a day (QID) | INTRAVENOUS | Status: DC | PRN
  Filled 2019-12-20: qty 50

## 2019-12-20 MED ORDER — BUPIVACAINE HCL (PF) 0.25 % IJ SOLN
INTRAMUSCULAR | Status: AC
  Filled 2019-12-20: qty 30

## 2019-12-20 MED ORDER — POLYETHYLENE GLYCOL 3350 17 G PO PACK: 17.0000 g | PACK | Freq: Every day | ORAL | Status: DC | PRN

## 2019-12-20 MED ORDER — PHENYLEPHRINE 40 MCG/ML (10ML) SYRINGE FOR IV PUSH (FOR BLOOD PRESSURE SUPPORT)
PREFILLED_SYRINGE | INTRAVENOUS | Status: DC | PRN
  Administered 2019-12-20 (×4): 120 ug via INTRAVENOUS

## 2019-12-20 MED ORDER — BISACODYL 10 MG RE SUPP: 10.0000 mg | Freq: Every day | RECTAL | Status: DC | PRN

## 2019-12-20 MED ORDER — MAGNESIUM CITRATE PO SOLN: 1.0000 | Freq: Once | ORAL | Status: DC | PRN

## 2019-12-20 MED ORDER — DEXAMETHASONE SODIUM PHOSPHATE 10 MG/ML IJ SOLN
INTRAMUSCULAR | Status: AC
  Filled 2019-12-20: qty 1

## 2019-12-20 MED ORDER — SUCCINYLCHOLINE CHLORIDE 200 MG/10ML IV SOSY
PREFILLED_SYRINGE | INTRAVENOUS | Status: AC
  Filled 2019-12-20: qty 10

## 2019-12-20 MED ORDER — 0.9 % SODIUM CHLORIDE (POUR BTL) OPTIME
TOPICAL | Status: DC | PRN
  Administered 2019-12-20: 1000 mL

## 2019-12-20 MED ORDER — ROCURONIUM BROMIDE 10 MG/ML (PF) SYRINGE
PREFILLED_SYRINGE | INTRAVENOUS | Status: AC
  Filled 2019-12-20: qty 10

## 2019-12-20 MED ORDER — GLYCOPYRROLATE PF 0.2 MG/ML IJ SOSY
PREFILLED_SYRINGE | INTRAMUSCULAR | Status: DC | PRN
  Administered 2019-12-20: .1 mg via INTRAVENOUS

## 2019-12-20 MED ORDER — MENTHOL 3 MG MT LOZG: 1.0000 | LOZENGE | OROMUCOSAL | Status: DC | PRN

## 2019-12-20 MED ORDER — MORPHINE SULFATE (PF) 4 MG/ML IV SOLN: 0.5000 mg | INTRAVENOUS | Status: DC | PRN

## 2019-12-20 MED ORDER — ACETAMINOPHEN 10 MG/ML IV SOLN
1000.0000 mg | Freq: Four times a day (QID) | INTRAVENOUS | Status: DC
  Administered 2019-12-20: 1000 mg via INTRAVENOUS
  Filled 2019-12-20: qty 100

## 2019-12-20 MED ORDER — FENTANYL CITRATE (PF) 100 MCG/2ML IJ SOLN
INTRAMUSCULAR | Status: AC
  Filled 2019-12-20: qty 2

## 2019-12-20 MED ORDER — PROPOFOL 1000 MG/100ML IV EMUL
INTRAVENOUS | Status: AC
  Filled 2019-12-20: qty 100

## 2019-12-20 MED ORDER — DEXAMETHASONE SODIUM PHOSPHATE 10 MG/ML IJ SOLN
10.0000 mg | Freq: Once | INTRAMUSCULAR | Status: DC
  Filled 2019-12-20: qty 1

## 2019-12-20 MED ORDER — TRAMADOL HCL 50 MG PO TABS: 50.0000 mg | ORAL_TABLET | Freq: Four times a day (QID) | ORAL | Status: DC | PRN

## 2019-12-20 MED ORDER — DEXAMETHASONE SODIUM PHOSPHATE 10 MG/ML IJ SOLN
INTRAMUSCULAR | Status: DC | PRN
  Administered 2019-12-20: 10 mg via INTRAVENOUS

## 2019-12-20 MED ORDER — METOCLOPRAMIDE HCL 5 MG/ML IJ SOLN: 5.0000 mg | Freq: Three times a day (TID) | INTRAMUSCULAR | Status: DC | PRN

## 2019-12-20 MED ORDER — BUPIVACAINE IN DEXTROSE 0.75-8.25 % IT SOLN
INTRATHECAL | Status: DC | PRN
  Administered 2019-12-20: 1.8 mL via INTRATHECAL

## 2019-12-20 MED ORDER — METHOCARBAMOL 500 MG PO TABS
500.0000 mg | ORAL_TABLET | Freq: Four times a day (QID) | ORAL | Status: DC | PRN
  Administered 2019-12-20: 500 mg via ORAL
  Filled 2019-12-20: qty 1

## 2019-12-20 MED ORDER — PHENYLEPHRINE HCL (PRESSORS) 10 MG/ML IV SOLN
INTRAVENOUS | Status: AC
  Filled 2019-12-20: qty 1

## 2019-12-20 MED ORDER — HYDROCODONE-ACETAMINOPHEN 5-325 MG PO TABS
1.0000 | ORAL_TABLET | ORAL | Status: DC | PRN
  Administered 2019-12-20: 1 via ORAL
  Filled 2019-12-20 (×2): qty 1

## 2019-12-20 MED ORDER — FENTANYL CITRATE (PF) 100 MCG/2ML IJ SOLN
INTRAMUSCULAR | Status: DC | PRN
  Administered 2019-12-20 (×2): 25 ug via INTRAVENOUS

## 2019-12-20 MED ORDER — MIDAZOLAM HCL 2 MG/2ML IJ SOLN
INTRAMUSCULAR | Status: AC
  Filled 2019-12-20: qty 2

## 2019-12-20 MED ORDER — ESMOLOL HCL 100 MG/10ML IV SOLN
INTRAVENOUS | Status: DC | PRN
  Administered 2019-12-20: 30 mg via INTRAVENOUS

## 2019-12-20 MED ORDER — FENTANYL CITRATE (PF) 100 MCG/2ML IJ SOLN: 25.0000 ug | INTRAMUSCULAR | Status: DC | PRN

## 2019-12-20 MED ORDER — BUPIVACAINE HCL 0.25 % IJ SOLN
INTRAMUSCULAR | Status: DC | PRN
  Administered 2019-12-20: 30 mL

## 2019-12-20 MED ORDER — LIDOCAINE 2% (20 MG/ML) 5 ML SYRINGE
INTRAMUSCULAR | Status: AC
  Filled 2019-12-20: qty 5

## 2019-12-20 MED ORDER — GLYCOPYRROLATE PF 0.2 MG/ML IJ SOSY
PREFILLED_SYRINGE | INTRAMUSCULAR | Status: AC
  Filled 2019-12-20: qty 1

## 2019-12-20 MED ORDER — LACTATED RINGERS IV SOLN: INTRAVENOUS | Status: DC

## 2019-12-20 MED ORDER — CHLORHEXIDINE GLUCONATE 0.12 % MT SOLN
15.0000 mL | Freq: Once | OROMUCOSAL | Status: AC
  Administered 2019-12-20: 15 mL via OROMUCOSAL

## 2019-12-20 MED ORDER — WATER FOR IRRIGATION, STERILE IR SOLN
Status: DC | PRN
  Administered 2019-12-20: 2000 mL

## 2019-12-20 MED ORDER — CEFAZOLIN SODIUM-DEXTROSE 2-4 GM/100ML-% IV SOLN
2.0000 g | Freq: Four times a day (QID) | INTRAVENOUS | Status: AC
  Administered 2019-12-20 (×2): 2 g via INTRAVENOUS
  Filled 2019-12-20 (×2): qty 100

## 2019-12-20 MED ORDER — PROPOFOL 500 MG/50ML IV EMUL
INTRAVENOUS | Status: DC | PRN
  Administered 2019-12-20: 50 ug/kg/min via INTRAVENOUS

## 2019-12-20 MED ORDER — PHENOL 1.4 % MT LIQD: 1.0000 | OROMUCOSAL | Status: DC | PRN

## 2019-12-20 MED ORDER — ONDANSETRON HCL 4 MG PO TABS
4.0000 mg | ORAL_TABLET | Freq: Four times a day (QID) | ORAL | Status: DC | PRN
  Filled 2019-12-20: qty 1

## 2019-12-20 MED ORDER — DILTIAZEM HCL ER COATED BEADS 240 MG PO CP24
240.0000 mg | ORAL_CAPSULE | Freq: Every day | ORAL | Status: DC
  Administered 2019-12-21: 240 mg via ORAL
  Filled 2019-12-20: qty 1

## 2019-12-20 MED ORDER — DOCUSATE SODIUM 100 MG PO CAPS
100.0000 mg | ORAL_CAPSULE | Freq: Two times a day (BID) | ORAL | Status: DC
  Administered 2019-12-20: 100 mg via ORAL
  Filled 2019-12-20 (×2): qty 1

## 2019-12-20 MED ORDER — APIXABAN 2.5 MG PO TABS
2.5000 mg | ORAL_TABLET | Freq: Two times a day (BID) | ORAL | Status: DC
  Administered 2019-12-21: 2.5 mg via ORAL
  Filled 2019-12-20: qty 1

## 2019-12-20 MED ORDER — EPHEDRINE 5 MG/ML INJ
INTRAVENOUS | Status: AC
  Filled 2019-12-20: qty 10

## 2019-12-20 MED ORDER — PHENYLEPHRINE 40 MCG/ML (10ML) SYRINGE FOR IV PUSH (FOR BLOOD PRESSURE SUPPORT)
PREFILLED_SYRINGE | INTRAVENOUS | Status: AC
  Filled 2019-12-20: qty 10

## 2019-12-20 MED ORDER — ONDANSETRON HCL 4 MG/2ML IJ SOLN
INTRAMUSCULAR | Status: DC | PRN
  Administered 2019-12-20: 4 mg via INTRAVENOUS

## 2019-12-20 MED ORDER — POVIDONE-IODINE 10 % EX SWAB
2.0000 "application " | Freq: Once | CUTANEOUS | Status: AC
  Administered 2019-12-20: 2 via TOPICAL

## 2019-12-20 MED ORDER — ONDANSETRON HCL 4 MG/2ML IJ SOLN: 4.0000 mg | Freq: Four times a day (QID) | INTRAMUSCULAR | Status: DC | PRN

## 2019-12-20 MED ORDER — PHENYLEPHRINE HCL-NACL 10-0.9 MG/250ML-% IV SOLN
INTRAVENOUS | Status: DC | PRN
  Administered 2019-12-20: 75 ug/min via INTRAVENOUS

## 2019-12-20 MED ORDER — METOCLOPRAMIDE HCL 5 MG PO TABS
5.0000 mg | ORAL_TABLET | Freq: Three times a day (TID) | ORAL | Status: DC | PRN
  Filled 2019-12-20: qty 2

## 2019-12-20 MED ORDER — MIDAZOLAM HCL 5 MG/5ML IJ SOLN
INTRAMUSCULAR | Status: DC | PRN
  Administered 2019-12-20: 2 mg via INTRAVENOUS

## 2019-12-20 MED ORDER — ACETAMINOPHEN 325 MG PO TABS: 325.0000 mg | ORAL_TABLET | Freq: Four times a day (QID) | ORAL | Status: DC | PRN

## 2019-12-20 SURGICAL SUPPLY — 44 items
BAG DECANTER FOR FLEXI CONT (MISCELLANEOUS) IMPLANT
BAG ZIPLOCK 12X15 (MISCELLANEOUS) IMPLANT
BLADE SAG 18X100X1.27 (BLADE) ×3 IMPLANT
CLOSURE WOUND 1/2 X4 (GAUZE/BANDAGES/DRESSINGS) ×1
COVER PERINEAL POST (MISCELLANEOUS) ×3 IMPLANT
COVER SURGICAL LIGHT HANDLE (MISCELLANEOUS) ×3 IMPLANT
COVER WAND RF STERILE (DRAPES) IMPLANT
CUP ACETBLR 52 OD PINNACLE (Hips) ×3 IMPLANT
DECANTER SPIKE VIAL GLASS SM (MISCELLANEOUS) ×3 IMPLANT
DRAPE STERI IOBAN 125X83 (DRAPES) ×3 IMPLANT
DRAPE U-SHAPE 47X51 STRL (DRAPES) ×6 IMPLANT
DRSG ADAPTIC 3X8 NADH LF (GAUZE/BANDAGES/DRESSINGS) ×3 IMPLANT
DRSG AQUACEL AG ADV 3.5X10 (GAUZE/BANDAGES/DRESSINGS) ×3 IMPLANT
DURAPREP 26ML APPLICATOR (WOUND CARE) ×3 IMPLANT
ELECT REM PT RETURN 15FT ADLT (MISCELLANEOUS) ×3 IMPLANT
EVACUATOR 1/8 PVC DRAIN (DRAIN) IMPLANT
GLOVE BIO SURGEON STRL SZ 6 (GLOVE) IMPLANT
GLOVE BIO SURGEON STRL SZ7 (GLOVE) IMPLANT
GLOVE BIO SURGEON STRL SZ8 (GLOVE) ×3 IMPLANT
GLOVE BIOGEL PI IND STRL 6.5 (GLOVE) IMPLANT
GLOVE BIOGEL PI IND STRL 7.0 (GLOVE) IMPLANT
GLOVE BIOGEL PI IND STRL 8 (GLOVE) ×1 IMPLANT
GLOVE BIOGEL PI INDICATOR 6.5 (GLOVE)
GLOVE BIOGEL PI INDICATOR 7.0 (GLOVE)
GLOVE BIOGEL PI INDICATOR 8 (GLOVE) ×2
GOWN STRL REUS W/TWL LRG LVL3 (GOWN DISPOSABLE) ×3 IMPLANT
GOWN STRL REUS W/TWL XL LVL3 (GOWN DISPOSABLE) IMPLANT
HEAD FEMORAL 32 CERAMIC (Hips) ×3 IMPLANT
HOLDER FOLEY CATH W/STRAP (MISCELLANEOUS) ×3 IMPLANT
KIT TURNOVER KIT A (KITS) IMPLANT
LINER MARATHON NEUT +4X52X32 (Hips) ×3 IMPLANT
MANIFOLD NEPTUNE II (INSTRUMENTS) ×3 IMPLANT
PACK ANTERIOR HIP CUSTOM (KITS) ×3 IMPLANT
PENCIL SMOKE EVACUATOR COATED (MISCELLANEOUS) ×3 IMPLANT
STEM FEM ACTIS HIGH SZ8 (Stem) ×3 IMPLANT
STRIP CLOSURE SKIN 1/2X4 (GAUZE/BANDAGES/DRESSINGS) ×2 IMPLANT
SUT ETHIBOND NAB CT1 #1 30IN (SUTURE) ×3 IMPLANT
SUT MNCRL AB 4-0 PS2 18 (SUTURE) ×3 IMPLANT
SUT STRATAFIX 0 PDS 27 VIOLET (SUTURE) ×3
SUT VIC AB 2-0 CT1 27 (SUTURE) ×4
SUT VIC AB 2-0 CT1 TAPERPNT 27 (SUTURE) ×2 IMPLANT
SUTURE STRATFX 0 PDS 27 VIOLET (SUTURE) ×1 IMPLANT
SYR 50ML LL SCALE MARK (SYRINGE) IMPLANT
TRAY FOLEY MTR SLVR 16FR STAT (SET/KITS/TRAYS/PACK) ×3 IMPLANT

## 2019-12-20 NOTE — Op Note (Signed)
OPERATIVE REPORT- TOTAL HIP ARTHROPLASTY   PREOPERATIVE DIAGNOSIS: Osteoarthritis of the Left hip.   POSTOPERATIVE DIAGNOSIS: Osteoarthritis of the Left  hip.   PROCEDURE: Left total hip arthroplasty, anterior approach.   SURGEON: Gaynelle Arabian, MD   ASSISTANT: Griffith Citron, PA-C  ANESTHESIA:  Spinal  ESTIMATED BLOOD LOSS:-600 mL    DRAINS: Hemovac x1.   COMPLICATIONS: None   CONDITION: PACU - hemodynamically stable.   BRIEF CLINICAL NOTE: Gary Escobar is a 69 y.o. male who has advanced end-  stage arthritis of their Left  hip with progressively worsening pain and  dysfunction.The patient has failed nonoperative management and presents for  total hip arthroplasty.   PROCEDURE IN DETAIL: After successful administration of spinal  anesthetic, the traction boots for the [facility] bed were placed on both  feet and the patient was placed onto the [facility] bed, boots placed into the leg  holders. The Left hip was then isolated from the perineum with plastic  drapes and prepped and draped in the usual sterile fashion. ASIS and  greater trochanter were marked and a oblique incision was made, starting  at about 1 cm lateral and 2 cm distal to the ASIS and coursing towards  the anterior cortex of the femur. The skin was cut with a 10 blade  through subcutaneous tissue to the level of the fascia overlying the  tensor fascia lata muscle. The fascia was then incised in line with the  incision at the junction of the anterior third and posterior 2/3rd. The  muscle was teased off the fascia and then the interval between the TFL  and the rectus was developed. The Hohmann retractor was then placed at  the top of the femoral neck over the capsule. The vessels overlying the  capsule were cauterized and the fat on top of the capsule was removed.  A Hohmann retractor was then placed anterior underneath the rectus  femoris to give exposure to the entire anterior capsule. A T-shaped   capsulotomy was performed. The edges were tagged and the femoral head  was identified.       Osteophytes are removed off the superior acetabulum.  The femoral neck was then cut in situ with an oscillating saw. Traction  was then applied to the left lower extremity utilizing the [facility]  traction. The femoral head was then removed. Retractors were placed  around the acetabulum and then circumferential removal of the labrum was  performed. Osteophytes were also removed. Reaming starts at 47 mm to  medialize and  Increased in 2 mm increments to 51 mm. We reamed in  approximately 40 degrees of abduction, 20 degrees anteversion. A 52 mm  pinnacle acetabular shell was then impacted in anatomic position under  fluoroscopic guidance with excellent purchase. We did not need to place  any additional dome screws. A 32 mm neutral + 4 marathon liner was then  placed into the acetabular shell.       The femoral lift was then placed along the lateral aspect of the femur  just distal to the vastus ridge. The leg was  externally rotated and capsule  was stripped off the inferior aspect of the femoral neck down to the  level of the lesser trochanter, this was done with electrocautery. The femur was lifted after this was performed. The  leg was then placed in an extended and adducted position essentially delivering the femur. We also removed the capsule superiorly and the piriformis from the piriformis  fossa to gain excellent exposure of the  proximal femur. Rongeur was used to remove some cancellous bone to get  into the lateral portion of the proximal femur for placement of the  initial starter reamer. The starter broaches was placed  the starter broach  and was shown to go down the center of the canal. Broaching  with the Actis system was then performed starting at size 0  coursing  Up to size 8. A size 8 had excellent torsional and rotational  and axial stability. The trial high offset neck was then placed   with a 32 + 1 trial head. The hip was then reduced. We confirmed that  the stem was in the canal both on AP and lateral x-rays. It also has excellent sizing. The hip was reduced with outstanding stability through full extension and full external rotation.. AP pelvis was taken and the leg lengths were measured and found to be equal. Hip was then dislocated again and the femoral head and neck removed. The  femoral broach was removed. Size 8 Actis stem with a high offset  neck was then impacted into the femur following native anteversion. Has  excellent purchase in the canal. Excellent torsional and rotational and  axial stability. It is confirmed to be in the canal on AP and lateral  fluoroscopic views. The 32 + 1 ceramic head was placed and the hip  reduced with outstanding stability. Again AP pelvis was taken and it  confirmed that the leg lengths were equal. The wound was then copiously  irrigated with saline solution and the capsule reattached and repaired  with Ethibond suture. 30 ml of .25% Bupivicaine was  injected into the capsule and into the edge of the tensor fascia lata as well as subcutaneous tissue. The fascia overlying the tensor fascia lata was then closed with a running #1 V-Loc. Subcu was closed with interrupted 2-0 Vicryl and subcuticular running 4-0 Monocryl. Incision was cleaned  and dried. Steri-Strips and a bulky sterile dressing applied. Hemovac  drain was hooked to suction and then the patient was awakened and transported to  recovery in stable condition.        Please note that a surgical assistant was a medical necessity for this procedure to perform it in a safe and expeditious manner. Assistant was necessary to provide appropriate retraction of vital neurovascular structures and to prevent femoral fracture and allow for anatomic placement of the prosthesis.  Gaynelle Arabian, M.D.

## 2019-12-20 NOTE — Anesthesia Procedure Notes (Signed)
Spinal  Patient location during procedure: OR Start time: 12/20/2019 9:50 AM End time: 12/20/2019 10:05 AM Staffing Performed: anesthesiologist  Anesthesiologist: Freddrick March, MD Preanesthetic Checklist Completed: patient identified, IV checked, risks and benefits discussed, surgical consent, monitors and equipment checked, pre-op evaluation and timeout performed Spinal Block Patient position: sitting Prep: DuraPrep and site prepped and draped Patient monitoring: cardiac monitor, continuous pulse ox and blood pressure Approach: left paramedian Location: L3-4 Injection technique: single-shot Needle Needle type: Pencan  Needle gauge: 24 G Needle length: 9 cm Assessment Sensory level: T6 Additional Notes Functioning IV was confirmed and monitors were applied. Sterile prep and drape, including hand hygiene and sterile gloves were used. The patient was positioned and the spine was prepped. The skin was anesthetized with lidocaine.  Free flow of clear CSF was obtained prior to injecting local anesthetic into the CSF.  The spinal needle aspirated freely following injection.  The needle was carefully withdrawn.  The patient tolerated the procedure well.   First attempt at L3-4 midline approach unsuccessful. Second attempt successful at L3-L4 left paramedian.

## 2019-12-20 NOTE — Interval H&P Note (Signed)
History and Physical Interval Note:  12/20/2019 8:30 AM  Gary Escobar  has presented today for surgery, with the diagnosis of Left hip osteoarthritis.  The various methods of treatment have been discussed with the patient and family. After consideration of risks, benefits and other options for treatment, the patient has consented to  Procedure(s) with comments: Wren (Left) - 156min as a surgical intervention.  The patient's history has been reviewed, patient examined, no change in status, stable for surgery.  I have reviewed the patient's chart and labs.  Questions were answered to the patient's satisfaction.     Gary Escobar

## 2019-12-20 NOTE — Transfer of Care (Signed)
Immediate Anesthesia Transfer of Care Note  Patient: Gary Escobar  Procedure(s) Performed: TOTAL HIP ARTHROPLASTY ANTERIOR APPROACH (Left Hip)  Patient Location: PACU  Anesthesia Type:Spinal  Level of Consciousness: awake, alert  and oriented  Airway & Oxygen Therapy: Patient Spontanous Breathing and Patient connected to face mask oxygen  Post-op Assessment: Report given to RN and Post -op Vital signs reviewed and stable  Post vital signs: Reviewed and stable  Last Vitals:  Vitals Value Taken Time  BP 97/64 12/20/19 1135  Temp    Pulse 89 12/20/19 1138  Resp 16 12/20/19 1138  SpO2 100 % 12/20/19 1138  Vitals shown include unvalidated device data.  Last Pain:  Vitals:   12/20/19 0845  TempSrc: Oral  PainSc:       Patients Stated Pain Goal: 5 (13/08/65 7846)  Complications: No complications documented.

## 2019-12-20 NOTE — Evaluation (Signed)
Physical Therapy Evaluation Patient Details Name: Gary Escobar MRN: [id number] DOB: 1950-10-11 Today's Date: 12/20/2019   History of Present Illness  Patient is 69 y.o. male s/p Lt THA anterior approach on 12/20/19 with PMH significant for HTN, HLD, OA.  Clinical Impression  Gary Escobar is a 69 y.o. male POD 0 s/p Lt THA. Patient reports independence with mobility at baseline. Patient is now limited by functional impairments (see PT problem list below) and requires min assist for transfers and gait with RW. Patient was able to ambulate ~60 feet with RW and min assist. Patient instructed in exercise to facilitate ROM and circulation. Patient will benefit from continued skilled PT interventions to address impairments and progress towards PLOF. Acute PT will follow to progress mobility and stair training in preparation for safe discharge home.     Follow Up Recommendations Follow surgeon's recommendation for DC plan and follow-up therapies    Equipment Recommendations  Rolling walker with 5" wheels;3in1 (PT)    Recommendations for Other Services       Precautions / Restrictions Precautions Precautions: Fall Restrictions Weight Bearing Restrictions: No Other Position/Activity Restrictions: WBAT      Mobility  Bed Mobility Overal bed mobility: Needs Assistance Bed Mobility: Supine to Sit     Supine to sit: Min guard;HOB elevated     General bed mobility comments: no assist required to move to EOB, pt using bed rail and taking extra time.     Transfers Overall transfer level: Needs assistance Equipment used: Rolling walker (2 wheeled) Transfers: Sit to/from Stand Sit to Stand: Min assist         General transfer comment: cues for safe technique with RW, light assist for power up and to steady in standing.  Ambulation/Gait Ambulation/Gait assistance: Min assist Gait Distance (Feet): 60 Feet Assistive device: Rolling walker (2 wheeled) Gait Pattern/deviations: Step-to  pattern;Decreased stride length;Decreased weight shift to left Gait velocity: decr   General Gait Details: VC's for step pattern and proximity to RW, no overt LOB, pt mildly unsteady and min assist to manage walker.   Stairs            Wheelchair Mobility    Modified Rankin (Stroke Patients Only)       Balance Overall balance assessment: Needs assistance Sitting-balance support: Feet supported Sitting balance-Leahy Scale: Good     Standing balance support: During functional activity;Bilateral upper extremity supported Standing balance-Leahy Scale: Poor Standing balance comment: requires RW                             Pertinent Vitals/Pain Pain Assessment: No/denies pain    Home Living Family/patient expects to be discharged to:: Private residence Living Arrangements: Spouse/significant other Available Help at Discharge: Family Type of Home: House Home Access: Stairs to enter Entrance Stairs-Rails: None Entrance Stairs-Number of Steps: 3 Home Layout: Two level;Full bath on main level;Able to live on main level with bedroom/bathroom Home Equipment: Kasandra Knudsen - single point      Prior Function Level of Independence: Independent               Hand Dominance   Dominant Hand: Right    Extremity/Trunk Assessment   Upper Extremity Assessment Upper Extremity Assessment: Overall WFL for tasks assessed    Lower Extremity Assessment Lower Extremity Assessment: Overall WFL for tasks assessed    Cervical / Trunk Assessment Cervical / Trunk Assessment: Normal  Communication   Communication: No difficulties  Cognition Arousal/Alertness: Awake/alert Behavior During Therapy: WFL for tasks assessed/performed Overall Cognitive Status: Within Functional Limits for tasks assessed                                        General Comments      Exercises Total Joint Exercises Ankle Circles/Pumps: AROM;Both;20 reps;Seated Quad Sets:  AROM;Left;5 reps;Seated Heel Slides: AROM;Left;5 reps;Seated   Assessment/Plan    PT Assessment Patient needs continued PT services  PT Problem List Decreased strength;Decreased range of motion;Decreased activity tolerance;Decreased balance;Decreased mobility;Decreased knowledge of use of DME;Decreased knowledge of precautions       PT Treatment Interventions DME instruction;Gait training;Stair training;Functional mobility training;Therapeutic activities;Therapeutic exercise;Balance training;Patient/family education    PT Goals (Current goals can be found in the Care Plan section)  Acute Rehab PT Goals Patient Stated Goal: be able to walk more without pain PT Goal Formulation: With patient Time For Goal Achievement: 12/27/19 Potential to Achieve Goals: Good    Frequency 7X/week   Barriers to discharge        Co-evaluation               AM-PAC PT "6 Clicks" Mobility  Outcome Measure Help needed turning from your back to your side while in a flat bed without using bedrails?: A Little Help needed moving from lying on your back to sitting on the side of a flat bed without using bedrails?: A Little Help needed moving to and from a bed to a chair (including a wheelchair)?: A Little Help needed standing up from a chair using your arms (e.g., wheelchair or bedside chair)?: A Little Help needed to walk in hospital room?: A Little Help needed climbing 3-5 steps with a railing? : A Little 6 Click Score: 18    End of Session Equipment Utilized During Treatment: Gait belt Activity Tolerance: Patient tolerated treatment well Patient left: in chair;with call bell/phone within reach;with chair alarm set;with family/visitor present Nurse Communication: Mobility status PT Visit Diagnosis: Muscle weakness (generalized) (M62.81);Difficulty in walking, not elsewhere classified (R26.2)    Time: 0174-9449 PT Time Calculation (min) (ACUTE ONLY): 25 min   Charges:   PT Evaluation $PT  Eval Low Complexity: 1 Low PT Treatments $Gait Training: 8-22 mins        Verner Mould, DPT Acute Rehabilitation Services  Office [phone] Pager [phone]  12/20/2019 5:32 PM

## 2019-12-20 NOTE — Discharge Instructions (Signed)
Gary Aluisio, MD Total Joint Specialist [facility] [address] [place], [place] [address] [phone]  ANTERIOR APPROACH TOTAL HIP REPLACEMENT POSTOPERATIVE DIRECTIONS     Hip Rehabilitation, Guidelines Following Surgery  The results of a hip operation are greatly improved after range of motion and muscle strengthening exercises. Follow all safety measures which are given to protect your hip. If any of these exercises cause increased pain or swelling in your joint, decrease the amount until you are comfortable again. Then slowly increase the exercises. Call your caregiver if you have problems or questions.   HOME CARE INSTRUCTIONS  . Remove items at home which could result in a fall. This includes throw rugs or furniture in walking pathways.   ICE to the affected hip as frequently as 20-30 minutes an hour and then as needed for pain and swelling. Continue to use ice on the hip for pain and swelling from surgery. You may notice swelling that will progress down to the foot and ankle. This is normal after surgery. Elevate the leg when you are not up walking on it.    Continue to use the breathing machine which will help keep your temperature down.  It is common for your temperature to cycle up and down following surgery, especially at night when you are not up moving around and exerting yourself.  The breathing machine keeps your lungs expanded and your temperature down.  DIET You may resume your previous home diet once your are discharged from the hospital.  DRESSING / WOUND CARE / SHOWERING . You have an adhesive waterproof bandage over the incision. Leave this in place until your first follow-up appointment. Once you remove this you will not need to place another bandage.  . You may begin showering 3 days following surgery, but do not submerge the incision under water.  ACTIVITY . For the first 3-5 days, it is important to rest and keep the operative  leg elevated. You should, as a general rule, rest for 50 minutes and walk/stretch for 10 minutes per hour. After 5 days, you may slowly increase activity as tolerated.  . Perform the exercises you were provided twice a day for about 15-20 minutes each session. Begin these 2 days following surgery. . Walk with your walker as instructed. Use the walker until you are comfortable transitioning to a cane. Walk with the cane in the opposite hand of the operative leg. You may discontinue the cane once you are comfortable and walking steadily. . Avoid periods of inactivity such as sitting longer than an hour when not asleep. This helps prevent blood clots.  . Do not drive a car for 6 weeks or until released by your surgeon.  . Do not drive while taking narcotics.  TED HOSE STOCKINGS Wear the elastic stockings on both legs for three weeks following surgery during the day. You may remove them at night while sleeping.  WEIGHT BEARING Weight bearing as tolerated with assist device (walker, cane, etc) as directed, use it as long as suggested by your surgeon or therapist, typically at least 4-6 weeks.  POSTOPERATIVE CONSTIPATION PROTOCOL Constipation - defined medically as fewer than three stools per week and severe constipation as less than one stool per week.  One of the most common issues patients have following surgery is constipation.  Even if you have a regular bowel pattern at home, your normal regimen is likely to be disrupted due to multiple reasons following surgery.  Combination of anesthesia, postoperative narcotics,   change in appetite and fluid intake all can affect your bowels.  In order to avoid complications following surgery, here are some recommendations in order to help you during your recovery period.  . Colace (docusate) - Pick up an over-the-counter form of Colace or another stool softener and take twice a day as long as you are requiring postoperative pain medications.  Take with a full  glass of water daily.  If you experience loose stools or diarrhea, hold the colace until you stool forms back up.  If your symptoms do not get better within 1 week or if they get worse, check with your doctor. . Dulcolax (bisacodyl) - Pick up over-the-counter and take as directed by the product packaging as needed to assist with the movement of your bowels.  Take with a full glass of water.  Use this product as needed if not relieved by Colace only.  . MiraLax (polyethylene glycol) - Pick up over-the-counter to have on hand.  MiraLax is a solution that will increase the amount of water in your bowels to assist with bowel movements.  Take as directed and can mix with a glass of water, juice, soda, coffee, or tea.  Take if you go more than two days without a movement.Do not use MiraLax more than once per day. Call your doctor if you are still constipated or irregular after using this medication for 7 days in a row.  If you continue to have problems with postoperative constipation, please contact the office for further assistance and recommendations.  If you experience "the worst abdominal pain ever" or develop nausea or vomiting, please contact the office immediatly for further recommendations for treatment.  ITCHING  If you experience itching with your medications, try taking only a single pain pill, or even half a pain pill at a time.  You can also use Benadryl over the counter for itching or also to help with sleep.   MEDICATIONS See your medication summary on the "After Visit Summary" that the nursing staff will review with you prior to discharge.  You may have some home medications which will be placed on hold until you complete the course of blood thinner medication.  It is important for you to complete the blood thinner medication as prescribed by your surgeon.  Continue your approved medications as instructed at time of discharge.  PRECAUTIONS If you experience chest pain or shortness of breath -  call 911 immediately for transfer to the hospital emergency department.  If you develop a fever greater that 101 F, purulent drainage from wound, increased redness or drainage from wound, foul odor from the wound/dressing, or calf pain - CONTACT YOUR SURGEON.                                                   FOLLOW-UP APPOINTMENTS Make sure you keep all of your appointments after your operation with your surgeon and caregivers. You should call the office at the above phone number and make an appointment for approximately two weeks after the date of your surgery or on the date instructed by your surgeon outlined in the "After Visit Summary".  RANGE OF MOTION AND STRENGTHENING EXERCISES  These exercises are designed to help you keep full movement of your hip joint. Follow your caregiver's or physical therapist's instructions. Perform all exercises about fifteen times, three   times per day or as directed. Exercise both hips, even if you have had only one joint replacement. These exercises can be done on a training (exercise) mat, on the floor, on a table or on a bed. Use whatever works the best and is most comfortable for you. Use music or television while you are exercising so that the exercises are a pleasant break in your day. This will make your life better with the exercises acting as a break in routine you can look forward to.  . Lying on your back, slowly slide your foot toward your buttocks, raising your knee up off the floor. Then slowly slide your foot back down until your leg is straight again.  . Lying on your back spread your legs as far apart as you can without causing discomfort.  . Lying on your side, raise your upper leg and foot straight up from the floor as far as is comfortable. Slowly lower the leg and repeat.  . Lying on your back, tighten up the muscle in the front of your thigh (quadriceps muscles). You can do this by keeping your leg straight and trying to raise your heel off the  floor. This helps strengthen the largest muscle supporting your knee.  . Lying on your back, tighten up the muscles of your buttocks both with the legs straight and with the knee bent at a comfortable angle while keeping your heel on the floor.   IF YOU ARE TRANSFERRED TO A SKILLED REHAB FACILITY If the patient is transferred to a skilled rehab facility following release from the hospital, a list of the current medications will be sent to the facility for the patient to continue.  When discharged from the skilled rehab facility, please have the facility set up the patient's Home Health Physical Therapy prior to being released. Also, the skilled facility will be responsible for providing the patient with their medications at time of release from the facility to include their pain medication, the muscle relaxants, and their blood thinner medication. If the patient is still at the rehab facility at time of the two week follow up appointment, the skilled rehab facility will also need to assist the patient in arranging follow up appointment in our office and any transportation needs.  MAKE SURE YOU:  . Understand these instructions.  . Get help right away if you are not doing well or get worse.    DENTAL ANTIBIOTICS:  In most cases prophylactic antibiotics for Dental procdeures after total joint surgery are not necessary.  Exceptions are as follows:  1. History of prior total joint infection  2. Severely immunocompromised (Organ Transplant, cancer chemotherapy, Rheumatoid biologic meds such as Humera)  3. Poorly controlled diabetes (A1C &gt; 8.0, blood glucose over 200)  If you have one of these conditions, contact your surgeon for an antibiotic prescription, prior to your dental procedure.    Pick up stool softner and laxative for home use following surgery while on pain medications. Do not submerge incision under water. Please use good hand washing techniques while changing dressing each  day. May shower starting three days after surgery. Please use a clean towel to pat the incision dry following showers. Continue to use ice for pain and swelling after surgery. Do not use any lotions or creams on the incision until instructed by your surgeon.  

## 2019-12-20 NOTE — Anesthesia Preprocedure Evaluation (Addendum)
Anesthesia Evaluation  Patient identified by MRN, date of birth, ID band Patient awake    Reviewed: Allergy & Precautions, NPO status , Patient's Chart, lab work & pertinent test results  Airway Mallampati: I  TM Distance: >3 FB Neck ROM: Full    Dental  (+) Chipped, Dental Advisory Given,    Pulmonary neg pulmonary ROS,    Pulmonary exam normal breath sounds clear to auscultation       Cardiovascular hypertension, Pt. on medications Normal cardiovascular exam+ dysrhythmias Atrial Fibrillation  Rhythm:Irregular Rate:Normal  EKG: 12/13/19 Rate 94 bpm  Atrial fibrillation   Echo 02/07/2018 INTERPRETATION  NORMAL LEFT VENTRICULAR SYSTOLIC FUNCTION  NORMAL RIGHT VENTRICULAR SYSTOLIC FUNCTION  MILD VALVULAR REGURGITATION (See above)  NO VALVULAR STENOSIS  Closest EF: >55% (Estimated)  Mitral: MILD MR  Tricuspid: MILD TR   Neuro/Psych negative neurological ROS  negative psych ROS   GI/Hepatic negative GI ROS, Neg liver ROS,   Endo/Other  negative endocrine ROS  Renal/GU negative Renal ROS  negative genitourinary   Musculoskeletal  (+) Arthritis ,   Abdominal   Peds  Hematology negative hematology ROS (+)   Anesthesia Other Findings On eliquis, last dose 12/16/19 at 6:30pm  Reproductive/Obstetrics                            Anesthesia Physical Anesthesia Plan  ASA: III  Anesthesia Plan: Spinal   Post-op Pain Management:    Induction:   PONV Risk Score and Plan: 1 and Treatment may vary due to age or medical condition, Propofol infusion and Midazolam  Airway Management Planned: Natural Airway  Additional Equipment:   Intra-op Plan:   Post-operative Plan:   Informed Consent: I have reviewed the patients History and Physical, chart, labs and discussed the procedure including the risks, benefits and alternatives for the proposed anesthesia with the patient or authorized  representative who has indicated his/her understanding and acceptance.     Dental advisory given  Plan Discussed with: CRNA  Anesthesia Plan Comments:         Anesthesia Quick Evaluation

## 2019-12-20 NOTE — Anesthesia Postprocedure Evaluation (Signed)
Anesthesia Post Note  Patient: Gary Escobar  Procedure(s) Performed: TOTAL HIP ARTHROPLASTY ANTERIOR APPROACH (Left Hip)     Patient location during evaluation: PACU Anesthesia Type: Spinal Level of consciousness: oriented and awake and alert Pain management: pain level controlled Vital Signs Assessment: post-procedure vital signs reviewed and stable Respiratory status: spontaneous breathing, respiratory function stable and patient connected to nasal cannula oxygen Cardiovascular status: blood pressure returned to baseline and stable Postop Assessment: no headache, no backache and no apparent nausea or vomiting Anesthetic complications: no   No complications documented.  Last Vitals:  Vitals:   12/20/19 1406 12/20/19 1505  BP: 102/73 106/78  Pulse: 85 87  Resp: 14 15  Temp: (!) 36.4 C (!) 36.4 C  SpO2: 100% 100%    Last Pain:  Vitals:   12/20/19 1323  TempSrc:   PainSc: 0-No pain                 Dashayla Theissen L Asiah Browder

## 2019-12-21 ENCOUNTER — Encounter (HOSPITAL_COMMUNITY): Payer: Self-pay | Admitting: Orthopedic Surgery

## 2019-12-21 DIAGNOSIS — M1612 Unilateral primary osteoarthritis, left hip: Secondary | ICD-10-CM | POA: Diagnosis not present

## 2019-12-21 LAB — BASIC METABOLIC PANEL
Anion gap: 11 (ref 5–15)
BUN: 14 mg/dL (ref 8–23)
CO2: 19 mmol/L — ABNORMAL LOW (ref 22–32)
Calcium: 8.7 mg/dL — ABNORMAL LOW (ref 8.9–10.3)
Chloride: 105 mmol/L (ref 98–111)
Creatinine, Ser: 0.77 mg/dL (ref 0.61–1.24)
GFR, Estimated: >60 mL/min (ref >60)
Glucose, Bld: 132 mg/dL — ABNORMAL HIGH (ref 70–99)
Potassium: 4 mmol/L (ref 3.5–5.1)
Sodium: 135 mmol/L (ref 135–145)

## 2019-12-21 LAB — CBC
HCT: 38.9 % — ABNORMAL LOW (ref 39.0–52.0)
Hemoglobin: 13.3 g/dL (ref 13.0–17.0)
MCH: 30.9 pg (ref 26.0–34.0)
MCHC: 34.2 g/dL (ref 30.0–36.0)
MCV: 90.5 fL (ref 80.0–100.0)
Platelets: 199 10*3/uL (ref 150–400)
RBC: 4.3 MIL/uL (ref 4.22–5.81)
RDW: 12.6 % (ref 11.5–15.5)
WBC: 18.8 10*3/uL — ABNORMAL HIGH (ref 4.0–10.5)
nRBC: 0 % (ref 0.0–0.2)

## 2019-12-21 MED ORDER — TRAMADOL HCL 50 MG PO TABS
50.0000 mg | ORAL_TABLET | Freq: Four times a day (QID) | ORAL | 0 refills | Status: DC | PRN
Start: 2019-12-21 — End: 2020-07-18

## 2019-12-21 MED ORDER — HYDROCODONE-ACETAMINOPHEN 5-325 MG PO TABS
1.0000 | ORAL_TABLET | Freq: Four times a day (QID) | ORAL | 0 refills | Status: DC | PRN
Start: 2019-12-21 — End: 2020-07-18

## 2019-12-21 MED ORDER — METHOCARBAMOL 500 MG PO TABS: 500.0000 mg | ORAL_TABLET | Freq: Four times a day (QID) | ORAL | 0 refills | Status: DC | PRN

## 2019-12-21 NOTE — Plan of Care (Signed)
Patient discharged home in stable condition 

## 2019-12-21 NOTE — Progress Notes (Signed)
Physical Therapy Treatment Patient Details Name: Gary Escobar MRN: [id number] DOB: [birth year] Today's Date: 12/21/2019    History of Present Illness Patient is 69 y.o. male s/p Lt THA anterior approach on 12/20/19 with PMH significant for HTN, HLD, OA.    PT Comments    Pt has met all PT goals for mobility. Pt's wife was present for the treatment session. Pt educated on HEP, steps with RW, and mobility. Pt is mod indep with RW. Pt would benefit from a few sessions of HHPT to address functional mobility in the home and progress gait with outdoor ambulation and transition to a cane. Pt agreeable to recommendations. Pt is ready for d/c home when medically cleared.   Follow Up Recommendations  Follow surgeon's recommendation for DC plan and follow-up therapies;Home health PT     Equipment Recommendations  Rolling walker with 5" wheels;3in1 (PT)    Recommendations for Other Services       Precautions / Restrictions Precautions Precautions: Fall Restrictions Weight Bearing Restrictions: No Other Position/Activity Restrictions: WBAT    Mobility  Bed Mobility Overal bed mobility: Modified Independent Bed Mobility: Supine to Sit     Supine to sit: Modified independent (Device/Increase time)        Transfers Overall transfer level: Modified independent Equipment used: Rolling walker (2 wheeled) Transfers: Sit to/from Stand Sit to Stand: Modified independent (Device/Increase time)            Ambulation/Gait Ambulation/Gait assistance: Modified independent (Device/Increase time) Gait Distance (Feet): 220 Feet Assistive device: Rolling walker (2 wheeled) Gait Pattern/deviations: Step-through pattern;Decreased stride length Gait velocity: decr       Stairs Stairs: Yes Stairs assistance: Min assist Stair Management: No rails;Backwards Number of Stairs: 3 General stair comments: wife was present and demonstrated good safe technique   Wheelchair Mobility     Modified Rankin (Stroke Patients Only)       Balance Overall balance assessment: No apparent balance deficits (not formally assessed)                                          Cognition Arousal/Alertness: Awake/alert Behavior During Therapy: WFL for tasks assessed/performed Overall Cognitive Status: Within Functional Limits for tasks assessed                                        Exercises Total Joint Exercises Ankle Circles/Pumps: AROM;Both;Seated;15 reps Quad Sets: AROM;Left;Seated;15 reps Heel Slides: AROM;Strengthening;Left;15 reps;Seated Hip ABduction/ADduction: AROM;Strengthening;Left;15 reps;Seated;Standing Long Arc Quad: AROM;Strengthening;Left;15 reps;Seated Knee Flexion: AROM;Strengthening;Left;15 reps;Standing Marching in Standing: AROM;Strengthening;Left;15 reps;Standing Standing Hip Extension: AROM;Strengthening;Left;15 reps;Standing    General Comments General comments (skin integrity, edema, etc.): wife was present for treatment session, handout given for HEP and also steps with walker and no rails      Pertinent Vitals/Pain Pain Assessment: 0-10 Pain Score: 2  Pain Location: L hip Pain Descriptors / Indicators: Discomfort Pain Intervention(s): Limited activity within patient's tolerance;Repositioned;Monitored during session    Home Living                      Prior Function            PT Goals (current goals can now be found in the care plan section) Progress towards PT goals: Goals met/education completed, patient discharged from PT  Frequency    7X/week      PT Plan Current plan remains appropriate    Co-evaluation              AM-PAC PT "6 Clicks" Mobility   Outcome Measure  Help needed turning from your back to your side while in a flat bed without using bedrails?: None Help needed moving from lying on your back to sitting on the side of a flat bed without using bedrails?:  None Help needed moving to and from a bed to a chair (including a wheelchair)?: None Help needed standing up from a chair using your arms (e.g., wheelchair or bedside chair)?: None Help needed to walk in hospital room?: None Help needed climbing 3-5 steps with a railing? : A Little 6 Click Score: 23    End of Session Equipment Utilized During Treatment: Gait belt Activity Tolerance: Patient tolerated treatment well Patient left: in chair;with call bell/phone within reach;with family/visitor present Nurse Communication: Mobility status PT Visit Diagnosis: Muscle weakness (generalized) (M62.81);Difficulty in walking, not elsewhere classified (R26.2)     Time: 4481-8563 PT Time Calculation (min) (ACUTE ONLY): 41 min  Charges:  $Gait Training: 8-22 mins $Therapeutic Exercise: 8-22 mins $Therapeutic Activity: 8-22 mins                       Lelon Mast 12/21/2019, 9:56 AM

## 2019-12-21 NOTE — Plan of Care (Signed)

## 2019-12-21 NOTE — Progress Notes (Signed)
   Subjective: 1 Day Post-Op Procedure(s) (LRB): TOTAL HIP ARTHROPLASTY ANTERIOR APPROACH (Left) Patient reports pain as mild.   Patient seen in rounds with Dr. Wynelle Link. Patient is well, and has had no acute complaints or problems. States he is feeling well this AM, did not sleep much. Denies chest pain or SOB. No issues overnight. Foley catheter removed this AM. We will continue therapy today, ambulated 43' yesterday.  Objective: Vital signs in last 24 hours: Temp:  [97.4 F (36.3 C)-98.6 F (37 C)] 97.7 F (36.5 C) (11/18 0645) Pulse Rate:  [82-103] 96 (11/18 0645) Resp:  [9-20] 15 (11/18 0645) BP: (91-143)/(64-102) 103/79 (11/18 0645) SpO2:  [98 %-100 %] 100 % (11/18 0645) Weight:  [95.5 kg] 95.5 kg (11/17 0833)  Intake/Output from previous day:  Intake/Output Summary (Last 24 hours) at 12/21/2019 0733 Last data filed at 12/21/2019 0600 Gross per 24 hour  Intake 3926.5 ml  Output 4300 ml  Net -373.5 ml     Intake/Output this shift: No intake/output data recorded.  Labs: Recent Labs    12/21/19 0325  HGB 13.3   Recent Labs    12/21/19 0325  WBC 18.8*  RBC 4.30  HCT 38.9*  PLT 199   Recent Labs    12/21/19 0325  NA 135  K 4.0  CL 105  CO2 19*  BUN 14  CREATININE 0.77  GLUCOSE 132*  CALCIUM 8.7*   No results for input(s): LABPT, INR in the last 72 hours.  Exam: General - Patient is Alert and Oriented Extremity - Neurologically intact Neurovascular intact Sensation intact distally Dorsiflexion/Plantar flexion intact Dressing - dressing C/D/I Motor Function - intact, moving foot and toes well on exam.   Past Medical History:  Diagnosis Date  . Arthritis    hip,   . Borderline hypertension   . Dysrhythmia 2019  . History of kidney stones 2001  . Hyperlipidemia   . Hypertension   . Positive cardiac stress test     Assessment/Plan: 1 Day Post-Op Procedure(s) (LRB): TOTAL HIP ARTHROPLASTY ANTERIOR APPROACH (Left) Principal Problem:    Primary osteoarthritis of left hip  Estimated body mass index is 32.01 kg/m as calculated from the following:   Height as of this encounter: 5\' 8"  (1.727 m).   Weight as of this encounter: 95.5 kg. Advance diet Up with therapy D/C IV fluids  DVT Prophylaxis - Eliquis Weight bearing as tolerated. Continue therapy.  Plan is to go Home after hospital stay. Plan for discharge today once cleared by physical therapy with HEP. Follow-up in the office on November 30th.  The PDMP database was reviewed today prior to any opioid medications being prescribed to this patient.   Theresa Duty, PA-C Orthopedic Surgery [phone] 12/21/2019, 7:33 AM

## 2019-12-25 NOTE — Discharge Summary (Signed)
Physician Discharge Summary   Patient ID: Gary Escobar MRN: [id number] DOB/AGE: 1951-01-09 69 y.o.  Admit date: 12/20/2019 Discharge date: 12/21/2019  Primary Diagnosis: Osteoarthritis, left hip   Admission Diagnoses:  Past Medical History:  Diagnosis Date   Arthritis    hip,    Borderline hypertension    Dysrhythmia 2019   History of kidney stones 2001   Hyperlipidemia    Hypertension    Positive cardiac stress test    Discharge Diagnoses:   Principal Problem:   Primary osteoarthritis of left hip  Estimated body mass index is 32.01 kg/m as calculated from the following:   Height as of this encounter: 5\' 8"  (1.727 m).   Weight as of this encounter: 95.5 kg.  Procedure:  Procedure(s) (LRB): TOTAL HIP ARTHROPLASTY ANTERIOR APPROACH (Left)   Consults: None  HPI: Gary Escobar is a 69 y.o. male who has advanced end-stage arthritis of their Left  hip with progressively worsening pain and dysfunction.The patient has failed nonoperative management and presents for total hip arthroplasty.   Laboratory Data: Admission on 12/20/2019, Discharged on 12/21/2019  Component Date Value Ref Range Status   ABO/RH(D) 12/20/2019    Final                   Value:A POS Performed at [facility], [place] [address], [place], [place] [address]    WBC 12/21/2019 18.8* 4.0 - 10.5 K/uL Final   RBC 12/21/2019 4.30  4.22 - 5.81 MIL/uL Final   Hemoglobin 12/21/2019 13.3  13.0 - 17.0 g/dL Final   HCT 12/21/2019 38.9* 39 - 52 % Final   MCV 12/21/2019 90.5  80.0 - 100.0 fL Final   MCH 12/21/2019 30.9  26.0 - 34.0 pg Final   MCHC 12/21/2019 34.2  30.0 - 36.0 g/dL Final   RDW 12/21/2019 12.6  11.5 - 15.5 % Final   Platelets 12/21/2019 199  150 - 400 K/uL Final   nRBC 12/21/2019 0.0  0.0 - 0.2 % Final   Performed at [facility], [place] [address], [place], [place] [address]   Sodium 12/21/2019 135  135 - 145 mmol/L Final   Potassium 12/21/2019  4.0  3.5 - 5.1 mmol/L Final   Chloride 12/21/2019 105  98 - 111 mmol/L Final   CO2 12/21/2019 19* 22 - 32 mmol/L Final   Glucose, Bld 12/21/2019 132* 70 - 99 mg/dL Final   Glucose reference range applies only to samples taken after fasting for at least 8 hours.   BUN 12/21/2019 14  8 - 23 mg/dL Final   Creatinine, Ser 12/21/2019 0.77  0.61 - 1.24 mg/dL Final   Calcium 12/21/2019 8.7* 8.9 - 10.3 mg/dL Final   GFR, Estimated 12/21/2019 >60  >60 mL/min Final   Comment: (NOTE) Calculated using the CKD-EPI Creatinine Equation (2021)    Anion gap 12/21/2019 11  5 - 15 Final   Performed at [facility], [place] [address], [place], [place] [address]  Hospital Outpatient Visit on 12/16/2019  Component Date Value Ref Range Status   SARS Coronavirus 2 12/16/2019 NEGATIVE  NEGATIVE Final   Comment: (NOTE) SARS-CoV-2 target nucleic acids are NOT DETECTED.  The SARS-CoV-2 RNA is generally detectable in upper and lower respiratory specimens during the acute phase of infection. Negative results do not preclude SARS-CoV-2 infection, do not rule out co-infections with other pathogens, and should not be used as the sole basis for treatment or other patient management decisions. Negative results  must be combined with clinical observations, patient history, and epidemiological information. The expected result is Negative.  Fact Sheet for Patients: SugarRoll.be  Fact Sheet for Healthcare Providers: https://www.woods-mathews.com/  This test is not yet approved or cleared by the [place] FDA and  has been authorized for detection and/or diagnosis of SARS-CoV-2 by FDA under an Emergency Use Authorization (EUA). This EUA will remain  in effect (meaning this test can be used) for the duration of the COVID-19 declaration under Se                          ction 564(b)(1) of the Act, 21 U.S.C. section 360bbb-3(b)(1), unless the  authorization is terminated or revoked sooner.  Performed at [facility], [place] [address] [place], [place] [address]   Hospital Outpatient Visit on 12/13/2019  Component Date Value Ref Range Status   aPTT 12/13/2019 34  24 - 36 seconds Final   Performed at [facility], [place] [address] [place], [place] [address]   WBC 12/13/2019 9.3  4.0 - 10.5 K/uL Final   RBC 12/13/2019 5.24  4.22 - 5.81 MIL/uL Final   Hemoglobin 12/13/2019 16.2  13.0 - 17.0 g/dL Final   HCT 12/13/2019 47.5  39 - 52 % Final   MCV 12/13/2019 90.6  80.0 - 100.0 fL Final   MCH 12/13/2019 30.9  26.0 - 34.0 pg Final   MCHC 12/13/2019 34.1  30.0 - 36.0 g/dL Final   RDW 12/13/2019 13.0  11.5 - 15.5 % Final   Platelets 12/13/2019 242  150 - 400 K/uL Final   nRBC 12/13/2019 0.0  0.0 - 0.2 % Final   Performed at [facility], [place] [address] [place], [place] [address]   Sodium 12/13/2019 141  135 - 145 mmol/L Final   Potassium 12/13/2019 4.6  3.5 - 5.1 mmol/L Final   Chloride 12/13/2019 107  98 - 111 mmol/L Final   CO2 12/13/2019 25  22 - 32 mmol/L Final   Glucose, Bld 12/13/2019 100* 70 - 99 mg/dL Final   Glucose reference range applies only to samples taken after fasting for at least 8 hours.   BUN 12/13/2019 16  8 - 23 mg/dL Final   Creatinine, Ser 12/13/2019 0.88  0.61 - 1.24 mg/dL Final   Calcium 12/13/2019 9.3  8.9 - 10.3 mg/dL Final   Total Protein 12/13/2019 7.8  6.5 - 8.1 g/dL Final   Albumin 12/13/2019 4.5  3.5 - 5.0 g/dL Final   AST 12/13/2019 20  15 - 41 U/L Final   ALT 12/13/2019 24  0 - 44 U/L Final   Alkaline Phosphatase 12/13/2019 78  38 - 126 U/L Final   Total Bilirubin 12/13/2019 0.8  0.3 - 1.2 mg/dL Final   GFR, Estimated 12/13/2019 >60  >60 mL/min Final   Comment: (NOTE) Calculated using the CKD-EPI Creatinine Equation (2021)    Anion gap 12/13/2019 9  5 - 15 Final   Performed at [facility], [place]  [address], [place], [place] [address]   Prothrombin Time 12/13/2019 15.2  11.4 - 15.2 seconds Final   INR 12/13/2019 1.3* 0.8 - 1.2 Final   Comment: (NOTE) INR goal varies based on device and disease states. Performed at [facility], [place] [address] [place], [place] [address]    ABO/RH(D) 12/13/2019 A POS   Final   Antibody Screen 12/13/2019 NEG   Final   Sample Expiration  12/13/2019 12/23/2019,2359   Final   Extend sample reason 12/13/2019    Final                   Value:NO TRANSFUSIONS OR PREGNANCY IN THE PAST 3 MONTHS Performed at [place] [address] [place], [place] [address]    MRSA, PCR 12/13/2019 NEGATIVE  NEGATIVE Final   Staphylococcus aureus 12/13/2019 POSITIVE* NEGATIVE Final   Comment: (NOTE) The Xpert SA Assay (FDA approved for NASAL specimens in patients 19 years of age and older), is one component of a comprehensive surveillance program. It is not intended to diagnose infection nor to guide or monitor treatment. Performed at [facility], [place] [address], [place], [place] [address]      X-Rays:DG Pelvis Portable  Result Date: 12/20/2019 CLINICAL DATA:  Total hip replacement EXAM: PORTABLE PELVIS 1-2 VIEWS COMPARISON:  Intraoperative images December 20, 2015 FINDINGS: Frontal view of mid to lower pelvis and hips obtained. There is a total hip replacement on the left with prosthetic components well-seated on frontal view. No fracture or dislocation. Slight narrowing of the right hip joint. There is evidence of bony overgrowth along the superolateral left acetabulum. Soft tissue air noted on the left. IMPRESSION: Total hip replacement on the left with prosthetic components well-seated on frontal view. No fracture or dislocation. Bony overgrowth again noted along the superolateral left acetabulum. Acute postoperative changes noted on the left. Slight narrowing right hip joint. Electronically Signed    By: Lowella Grip III M.D.   On: 12/20/2019 13:32   DG C-Arm 1-60 Min-No Report  Result Date: 12/20/2019 CLINICAL DATA:  Left anterior hip replacement EXAM: OPERATIVE left HIP (WITH PELVIS IF PERFORMED) 2 VIEWS TECHNIQUE: Fluoroscopic spot image(s) were submitted for interpretation post-operatively. COMPARISON:  pelvis 12/20/2019. FINDINGS: Left hip replacement in satisfactory position and alignment. No fracture or complication. IMPRESSION: Satisfactory left hip replacement. Electronically Signed   By: Franchot Gallo M.D.   On: 12/20/2019 12:51   DG HIP OPERATIVE UNILAT W OR W/O PELVIS LEFT  Result Date: 12/20/2019 CLINICAL DATA:  Left anterior hip replacement EXAM: OPERATIVE left HIP (WITH PELVIS IF PERFORMED) 2 VIEWS TECHNIQUE: Fluoroscopic spot image(s) were submitted for interpretation post-operatively. COMPARISON:  pelvis 12/20/2019. FINDINGS: Left hip replacement in satisfactory position and alignment. No fracture or complication. IMPRESSION: Satisfactory left hip replacement. Electronically Signed   By: Franchot Gallo M.D.   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 who was admitted to [facility]. They were brought to the operating room on 12/20/2019 and underwent Procedure(s): TOTAL HIP ARTHROPLASTY ANTERIOR APPROACH.  Patient tolerated the procedure well and was later transferred to the recovery room and then to the orthopaedic floor for postoperative care. They were given PO and IV analgesics for pain control following their surgery. They were given 24 hours of postoperative antibiotics of  Anti-infectives (From admission, onward)   Start     Dose/Rate Route Frequency Ordered Stop   12/20/19 1600  ceFAZolin (ANCEF) IVPB 2g/100 mL premix        2 g 200 mL/hr over 30 Minutes Intravenous Every 6 hours  12/20/19 1130 12/20/19 2324   12/20/19 0830  ceFAZolin (ANCEF) IVPB 2g/100 mL premix  2 g 200 mL/hr over 30 Minutes Intravenous On call to O.R. 12/20/19 0815 12/20/19 0942     and started on DVT prophylaxis in the form of Eliquis.   PT and OT were ordered for total joint protocol. Discharge planning consulted to help with postop disposition and equipment needs.  Patient had a good night on the evening of surgery. They started to get up OOB with therapy on POD #0. Pt was seen during rounds and was ready to go home pending progress with therapy. He worked with therapy on POD #1 and was meeting his goals. Pt was discharged to home later that day in stable condition.  Diet: Cardiac diet Activity: WBAT Follow-up: in 2 weeks Disposition: Home with HEP Discharged Condition: stable   Discharge Instructions    Call MD / Call 911   Complete by: As directed    If you experience chest pain or shortness of breath, CALL 911 and be transported to the hospital emergency room.  If you develope a fever above 101 F, pus (white drainage) or increased drainage or redness at the wound, or calf pain, call your surgeon's office.   Change dressing   Complete by: As directed    You have an adhesive waterproof bandage over the incision. Leave this in place until your first follow-up appointment. Once you remove this you will not need to place another bandage.   Constipation Prevention   Complete by: As directed    Drink plenty of fluids.  Prune juice may be helpful.  You may use a stool softener, such as Colace (over the counter) 100 mg twice a day.  Use MiraLax (over the counter) for constipation as needed.   Diet - low sodium heart healthy   Complete by: As directed    Do not sit on low chairs, stoools or toilet seats, as it may be difficult to get up from low surfaces   Complete by: As directed    Driving restrictions   Complete by: As directed    No driving for two weeks   TED hose   Complete by: As  directed    Use stockings (TED hose) for three weeks on both leg(s).  You may remove them at night for sleeping.   Weight bearing as tolerated   Complete by: As directed      Allergies as of 12/21/2019   No Known Allergies     Medication List    TAKE these medications   diltiazem 240 MG 24 hr capsule Commonly known as: CARDIZEM CD Take 240 mg by mouth daily.   Eliquis 5 MG Tabs tablet Generic drug: apixaban Take 5 mg by mouth 2 (two) times daily.   FLAXSEED OIL PO Take by mouth 2 (two) times a week. Meal / Mix with yogurt   HYDROcodone-acetaminophen 5-325 MG tablet Commonly known as: NORCO/VICODIN Take 1-2 tablets by mouth every 6 (six) hours as needed for severe pain.   MEGA MULTI MEN PO Take 1 tablet by mouth daily.   methocarbamol 500 MG tablet Commonly known as: ROBAXIN Take 1 tablet (500 mg total) by mouth every 6 (six) hours as needed for muscle spasms.   multivitamin tablet Take 1 tablet by mouth daily. Gnc   omega-3 acid ethyl esters 1 g capsule Commonly known as: LOVAZA Take 1 g by mouth daily.   tadalafil 5 MG tablet Commonly known as: CIALIS Take 1-2 tablets (5-10 mg total) by mouth daily as needed for erectile dysfunction.   traMADol  50 MG tablet Commonly known as: ULTRAM Take 1-2 tablets (50-100 mg total) by mouth every 6 (six) hours as needed for moderate pain.   Turmeric 500 MG Caps Take 500 mg by mouth daily.            Discharge Care Instructions  (From admission, onward)         Start     Ordered   12/21/19 0000  Weight bearing as tolerated        12/21/19 0736   12/21/19 0000  Change dressing       Comments: You have an adhesive waterproof bandage over the incision. Leave this in place until your first follow-up appointment. Once you remove this you will not need to place another bandage.   12/21/19 0736          Follow-up Information    Gaynelle Arabian, MD. Schedule an appointment as soon as possible for a visit on  01/02/2020.   Specialty: Orthopedic Surgery Contact information: [address] [place] [address] [phone]               Signed: Theresa Duty, PA-C Orthopedic Surgery 12/25/2019, 7:42 AM

## 2020-01-01 DIAGNOSIS — Z96642 Presence of left artificial hip joint: Secondary | ICD-10-CM | POA: Insufficient documentation

## 2020-03-27 ENCOUNTER — Ambulatory Visit: Payer: Medicare PPO | Admitting: Dermatology

## 2020-04-30 ENCOUNTER — Encounter: Payer: Self-pay | Admitting: Dermatology

## 2020-04-30 ENCOUNTER — Ambulatory Visit: Payer: Medicare PPO | Admitting: Dermatology

## 2020-04-30 ENCOUNTER — Other Ambulatory Visit: Payer: Self-pay

## 2020-04-30 DIAGNOSIS — D225 Melanocytic nevi of trunk: Secondary | ICD-10-CM | POA: Diagnosis not present

## 2020-04-30 DIAGNOSIS — Z86018 Personal history of other benign neoplasm: Secondary | ICD-10-CM

## 2020-04-30 DIAGNOSIS — L821 Other seborrheic keratosis: Secondary | ICD-10-CM

## 2020-04-30 DIAGNOSIS — L578 Other skin changes due to chronic exposure to nonionizing radiation: Secondary | ICD-10-CM | POA: Diagnosis not present

## 2020-04-30 DIAGNOSIS — D492 Neoplasm of unspecified behavior of bone, soft tissue, and skin: Secondary | ICD-10-CM

## 2020-04-30 DIAGNOSIS — D229 Melanocytic nevi, unspecified: Secondary | ICD-10-CM | POA: Diagnosis not present

## 2020-04-30 NOTE — Patient Instructions (Addendum)
Wound Care Instructions  1. Cleanse wound gently with soap and water once a day then pat dry with clean gauze. Apply a thing coat of Petrolatum (petroleum jelly, "Vaseline") over the wound (unless you have an allergy to this). We recommend that you use a new, sterile tube of Vaseline. Do not pick or remove scabs. Do not remove the yellow or white "healing tissue" from the base of the wound.  2. Cover the wound with fresh, clean, nonstick gauze and secure with paper tape. You may use Band-Aids in place of gauze and tape if the would is small enough, but would recommend trimming much of the tape off as there is often too much. Sometimes Band-Aids can irritate the skin.  3. You should call the office for your biopsy report after 1 week if you have not already been contacted.  4. If you experience any problems, such as abnormal amounts of bleeding, swelling, significant bruising, significant pain, or evidence of infection, please call the office immediately.  5. FOR ADULT SURGERY PATIENTS: If you need something for pain relief you may take 1 extra strength Tylenol (acetaminophen) AND 2 Ibuprofen (200mg  each) together every 4 hours as needed for pain. (do not take these if you are allergic to them or if you have a reason you should not take them.) Typically, you may only need pain medication for 1 to 3 days.     Melanoma ABCDEs  Melanoma is the most dangerous type of skin cancer, and is the leading cause of death from skin disease.  You are more likely to develop melanoma if you:  Have light-colored skin, light-colored eyes, or red or blond hair  Spend a lot of time in the sun  Tan regularly, either outdoors or in a tanning bed  Have had blistering sunburns, especially during childhood  Have a close family member who has had a melanoma  Have atypical moles or large birthmarks  Early detection of melanoma is key since treatment is typically straightforward and cure rates are extremely high if  we catch it early.   The first sign of melanoma is often a change in a mole or a new dark spot.  The ABCDE system is a way of remembering the signs of melanoma.  A for asymmetry:  The two halves do not match. B for border:  The edges of the growth are irregular. C for color:  A mixture of colors are present instead of an even brown color. D for diameter:  Melanomas are usually (but not always) greater than 30mm - the size of a pencil eraser. E for evolution:  The spot keeps changing in size, shape, and color.  Please check your skin once per month between visits. You can use a small mirror in front and a large mirror behind you to keep an eye on the back side or your body.   If you see any new or changing lesions before your next follow-up, please call to schedule a visit.  Please continue daily skin protection including broad spectrum sunscreen SPF 30+ to sun-exposed areas, reapplying every 2 hours as needed when you're outdoors.     Recommend taking Heliocare sun protection supplement daily in sunny weather for additional sun protection. For maximum protection on the sunniest days, you can take up to 2 capsules of regular Heliocare OR take 1 capsule of Heliocare Ultra. For prolonged exposure (such as a full day in the sun), you can repeat your dose of the supplement 4 hours  after your first dose. Heliocare can be purchased at [facility] or at [email or web address].

## 2020-04-30 NOTE — Progress Notes (Signed)
   Follow-Up Visit   Subjective  Gary Escobar is a 70 y.o. male who presents for the following: Follow-up (6 months f/u L lateral thigh Dysplastic nevus with moderate atypia removed 09-27-2019).   He says there may be a spot at his back he wanted checked.  The following portions of the chart were reviewed this encounter and updated as appropriate:   Tobacco  Allergies  Meds  Problems  Med Hx  Surg Hx  Fam Hx      Review of Systems:  No other skin or systemic complaints except as noted in HPI or Assessment and Plan.  Objective  Well appearing patient in no apparent distress; mood and affect are within normal limits.  A focused examination was performed including Left lateral thigh, chest, back, arms. Relevant physical exam findings are noted in the Assessment and Plan.  Objective  Left lateral thigh: Scar with no evidence of recurrence.   Objective  Right mid back: 0.4 cm med brown thin papule         Assessment & Plan  History of dysplastic nevus Left lateral thigh  - No evidence of recurrence today - Recommend regular full body skin exams - Recommend daily broad spectrum sunscreen SPF 30+ to sun-exposed areas, reapply every 2 hours as needed.  - Call if any new or changing lesions are noted between office visits   Neoplasm of skin Right mid back  Epidermal / dermal shaving  Lesion diameter (cm):  0.4 Informed consent: discussed and consent obtained   Patient was prepped and draped in usual sterile fashion: area prepped with alcohol. Anesthesia: the lesion was anesthetized in a standard fashion   Anesthetic:  1% lidocaine w/ epinephrine 1-100,000 buffered w/ 8.4% NaHCO3 Instrument used: flexible razor blade   Hemostasis achieved with: pressure, aluminum chloride and electrodesiccation   Outcome: patient tolerated procedure well   Post-procedure details: wound care instructions given   Post-procedure details comment:  Ointment and small bandage  applied  Specimen 1 - Surgical pathology Differential Diagnosis: R/O Atypia   Check Margins: No 0.4 cm med brown thin papule  Actinic Damage - chronic, secondary to cumulative UV radiation exposure/sun exposure over time - diffuse scaly erythematous macules with underlying dyspigmentation - Recommend daily broad spectrum sunscreen SPF 30+ to sun-exposed areas, reapply every 2 hours as needed.  - Recommend staying in the shade or wearing long sleeves, sun glasses (UVA+UVB protection) and wide brim hats (4-inch brim around the entire circumference of the hat). - Call for new or changing lesions.  Melanocytic Nevi - Tan-brown and/or pink-flesh-colored symmetric macules and papules - Benign appearing on exam today - Observation - Call clinic for new or changing moles - Recommend daily use of broad spectrum spf 30+ sunscreen to sun-exposed areas.   Seborrheic Keratoses - Stuck-on, waxy, tan-brown papules and/or plaques  - Benign-appearing - Discussed benign etiology and prognosis. - Observe - Call for any changes  Return in about 5 months (around 09/30/2020) for TBSE.  I, Marye Round, CMA, am acting as scribe for Forest Gleason, MD .  Documentation: I have reviewed the above documentation for accuracy and completeness, and I agree with the above.  Forest Gleason, MD

## 2020-05-02 ENCOUNTER — Telehealth: Payer: Self-pay

## 2020-05-02 NOTE — Telephone Encounter (Signed)
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 On the spectrum from normal mole to melanoma skin cancer, this is in between but it is much closer to a normal mole.  - These typically do not progress to melanoma or cause any trouble.  - People who have a history of atypical moles do have a slightly increased risk of developing melanoma somewhere on the body, so a yearly full body skin exam by a dermatologist is recommended.  - Monthly self skin checks and daily sun protection are also recommended.  - Please call if you notice a dark spot coming back where this biopsy was taken.  - Please also call if you notice any new or changing spots anywhere else on the body before your follow-up visit.  - Please call our office or send [place] a message if you have any questions or concerns about this biopsy result.   MAs please call. Thank you!

## 2020-05-02 NOTE — Telephone Encounter (Signed)
Advised pt of bx reslts/sh

## 2020-06-24 ENCOUNTER — Other Ambulatory Visit: Payer: Self-pay | Admitting: Urology

## 2020-07-18 ENCOUNTER — Ambulatory Visit: Payer: Self-pay | Admitting: Urology

## 2020-07-18 ENCOUNTER — Ambulatory Visit (INDEPENDENT_AMBULATORY_CARE_PROVIDER_SITE_OTHER): Payer: Medicare PPO | Admitting: Urology

## 2020-07-18 ENCOUNTER — Encounter: Payer: Self-pay | Admitting: Urology

## 2020-07-18 ENCOUNTER — Other Ambulatory Visit: Payer: Self-pay

## 2020-07-18 VITALS — BP 132/84 | HR 101 | Ht 68.0 in | Wt 201.0 lb

## 2020-07-18 DIAGNOSIS — N138 Other obstructive and reflux uropathy: Secondary | ICD-10-CM

## 2020-07-18 DIAGNOSIS — N401 Enlarged prostate with lower urinary tract symptoms: Secondary | ICD-10-CM | POA: Diagnosis not present

## 2020-07-18 DIAGNOSIS — R3121 Asymptomatic microscopic hematuria: Secondary | ICD-10-CM

## 2020-07-18 DIAGNOSIS — N529 Male erectile dysfunction, unspecified: Secondary | ICD-10-CM | POA: Diagnosis not present

## 2020-07-18 DIAGNOSIS — Z125 Encounter for screening for malignant neoplasm of prostate: Secondary | ICD-10-CM | POA: Diagnosis not present

## 2020-07-18 MED ORDER — TADALAFIL 5 MG PO TABS: ORAL_TABLET | ORAL | 11 refills | Status: DC

## 2020-07-18 NOTE — Progress Notes (Signed)
   07/18/2020 2:14 PM   Tobe Sos 03/03/1950 [id number]  Reason for visit: Follow up PSA screening, ED, BPH, history of hematuria  HPI: 70 year old male with long history of microscopic hematuria and multiple negative work-ups previously.  Most recently he underwent a CT urogram in June 2021 which was benign, and he deferred cystoscopy at that time secondary to his multiple negative work-ups previously.  He did have a urinalysis with PCP recently with sterile pyuria, but no microscopic hematuria.  Suspect this may have just been contaminant.  Return precautions including gross hematuria discussed.  We have had him on Cialis 5 mg daily for both the ED and BPH, and this is working well for him.  He states that improves his urinary symptoms.  He occasionally feels like the erections are not quite as good as they could be.  We discussed the dose could be increased as needed with a max of 20 mg.  Finally, in terms of PSA screening, PSA remains stable at zero 1.3 from 0.93 previously which is well within the normal range.  We again had a discussion about the AUA guidelines that do not recommend routine screening in men over age 67, but he would like to continue yearly screening.  Cialis refilled for BPH and ED RTC 1 year with PSA prior   Billey Co, MD  [place] [address] [place], [place] [address] [phone]

## 2020-07-18 NOTE — Patient Instructions (Signed)
Prostate Cancer Screening  Prostate cancer screening is a test that is done to check for the presence of prostate cancer in men. The prostate gland is a walnut-sized gland that is located below the bladder and in front of the rectum in males. The function of the prostate is to add fluid to semen during ejaculation. Prostate cancer isthe second most common type of cancer in men. Who should have prostate cancer screening?  Screening recommendations vary based on age and other risk factors. Screening is recommended if: You are older than age 77. If you are age 58-69, talk with your health care provider about your need for screening and how often screening should be done. Because most prostate cancers are slow growing and will not cause death, screening is generally reserved in this age group for men who have a 10-15-year life expectancy. You are younger than age 77, and you have these risk factors: Being a black male or a male of African descent. Having a father, brother, or uncle who has been diagnosed with prostate cancer. The risk is higher if your family member's cancer occurred at an early age. Screening is not recommended if: You are younger than age 75. You are between the ages of 70 and 71 and you have no risk factors. You are 50 years of age or older. At this age, the risks that screening can cause are greater than the benefits that it may provide. If you are at high risk for prostate cancer, your health care provider may recommend that you have screenings more often or that you start screening at ayounger age. How is screening for prostate cancer done? The recommended prostate cancer screening test is a blood test called the prostate-specific antigen (PSA) test. PSA is a protein that is made in the prostate. As you age, your prostate naturally produces more PSA. Abnormally high PSA levels may be caused by: Prostate cancer. An enlarged prostate that is not caused by cancer (benign prostatic  hyperplasia, BPH). This condition is very common in older men. A prostate gland infection (prostatitis). Depending on the PSA results, you may need more tests, such as: A physical exam to check the size of your prostate gland. Blood and imaging tests. A procedure to remove tissue samples from your prostate gland for testing (biopsy). What are the benefits of prostate cancer screening? Screening can help to identify cancer at an early stage, before symptoms start and when the cancer can be treated more easily. There is a small chance that screening may lower your risk of dying from prostate cancer. The chance is small because prostate cancer is a slow-growing cancer, and most men with prostate cancer die from a different cause. What are the risks of prostate cancer screening? The main risk of prostate cancer screening is diagnosing and treating prostate cancer that would never have caused any symptoms or problems. This is called overdiagnosisand overtreatment. PSA screening cannot tell you if your PSA is high due to cancer or a different cause. A prostate biopsy is the only procedure to diagnose prostate cancer. Even the results of a biopsy may not tell you if your cancer needs to be treated. Slow-growing prostate cancer may not need any treatment other than monitoring, so diagnosing and treating it may causeunnecessary stress or other side effects. A prostate biopsy may also cause: Infection or fever. A false negative. This is a result that shows that you do not have prostate cancer when you actually do have prostate cancer. Questions to  ask your health care provider When should I start prostate cancer screening? What is my risk for prostate cancer? How often do I need screening? What type of screening tests do I need? How do I get my test results? What do my results mean? Do I need treatment? Where to find more information The American Cancer Society: www.cancer.org American Urological  Association: www.auanet.org Contact a health care provider if: You have difficulty urinating. You have pain when you urinate or ejaculate. You have blood in your urine or semen. You have pain in your back or in the area of your prostate. Summary Prostate cancer is a common type of cancer in men. The prostate gland is located below the bladder and in front of the rectum. This gland adds fluid to semen during ejaculation. Prostate cancer screening may identify cancer at an early stage, when the cancer can be treated more easily. The prostate-specific antigen (PSA) test is the recommended screening test for prostate cancer. Discuss the risks and benefits of prostate cancer screening with your health care provider. If you are age 70 or older, the risks that screening can cause are greater than the benefits that it may provide. This information is not intended to replace advice given to you by your health care provider. Make sure you discuss any questions you have with your healthcare provider. Document Revised: 01/18/2020 Document Reviewed: 09/01/2018 Elsevier Patient Education  [place].

## 2020-10-02 ENCOUNTER — Ambulatory Visit: Payer: Medicare PPO | Admitting: Dermatology

## 2020-10-02 ENCOUNTER — Other Ambulatory Visit: Payer: Self-pay

## 2020-10-02 ENCOUNTER — Encounter: Payer: Self-pay | Admitting: Dermatology

## 2020-10-02 DIAGNOSIS — L578 Other skin changes due to chronic exposure to nonionizing radiation: Secondary | ICD-10-CM | POA: Diagnosis not present

## 2020-10-02 DIAGNOSIS — Z1283 Encounter for screening for malignant neoplasm of skin: Secondary | ICD-10-CM

## 2020-10-02 DIAGNOSIS — D2261 Melanocytic nevi of right upper limb, including shoulder: Secondary | ICD-10-CM

## 2020-10-02 DIAGNOSIS — L814 Other melanin hyperpigmentation: Secondary | ICD-10-CM

## 2020-10-02 DIAGNOSIS — L821 Other seborrheic keratosis: Secondary | ICD-10-CM

## 2020-10-02 DIAGNOSIS — D485 Neoplasm of uncertain behavior of skin: Secondary | ICD-10-CM

## 2020-10-02 DIAGNOSIS — D1800 Hemangioma unspecified site: Secondary | ICD-10-CM

## 2020-10-02 DIAGNOSIS — L820 Inflamed seborrheic keratosis: Secondary | ICD-10-CM

## 2020-10-02 DIAGNOSIS — Z86018 Personal history of other benign neoplasm: Secondary | ICD-10-CM

## 2020-10-02 DIAGNOSIS — D229 Melanocytic nevi, unspecified: Secondary | ICD-10-CM

## 2020-10-02 HISTORY — DX: Personal history of other benign neoplasm: Z86.018

## 2020-10-02 NOTE — Patient Instructions (Addendum)
Wound Care Instructions  Cleanse wound gently with soap and water once a day then pat dry with clean gauze. Apply a thing coat of Petrolatum (petroleum jelly, "Vaseline") over the wound (unless you have an allergy to this). We recommend that you use a new, sterile tube of Vaseline. Do not pick or remove scabs. Do not remove the yellow or white "healing tissue" from the base of the wound.  Cover the wound with fresh, clean, nonstick gauze and secure with paper tape. You may use Band-Aids in place of gauze and tape if the would is small enough, but would recommend trimming much of the tape off as there is often too much. Sometimes Band-Aids can irritate the skin.  You should call the office for your biopsy report after 1 week if you have not already been contacted.  If you experience any problems, such as abnormal amounts of bleeding, swelling, significant bruising, significant pain, or evidence of infection, please call the office immediately.  FOR ADULT SURGERY PATIENTS: If you need something for pain relief you may take 1 extra strength Tylenol (acetaminophen) AND 2 Ibuprofen ('200mg'$  each) together every 4 hours as needed for pain. (do not take these if you are allergic to them or if you have a reason you should not take them.) Typically, you may only need pain medication for 1 to 3 days.   Cryotherapy Aftercare  Wash gently with soap and water everyday.   Apply Vaseline and Band-Aid daily until healed.   Prior to procedure, discussed risks of blister formation, small wound, skin dyspigmentation, or rare scar following cryotherapy. Recommend Vaseline ointment to treated areas while healing.   Melanoma ABCDEs  Melanoma is the most dangerous type of skin cancer, and is the leading cause of death from skin disease.  You are more likely to develop melanoma if you: Have light-colored skin, light-colored eyes, or red or blond hair Spend a lot of time in the sun Tan regularly, either outdoors or in  a tanning bed Have had blistering sunburns, especially during childhood Have a close family member who has had a melanoma Have atypical moles or large birthmarks  Early detection of melanoma is key since treatment is typically straightforward and cure rates are extremely high if we catch it early.   The first sign of melanoma is often a change in a mole or a new dark spot.  The ABCDE system is a way of remembering the signs of melanoma.  A for asymmetry:  The two halves do not match. B for border:  The edges of the growth are irregular. C for color:  A mixture of colors are present instead of an even brown color. D for diameter:  Melanomas are usually (but not always) greater than 23m - the size of a pencil eraser. E for evolution:  The spot keeps changing in size, shape, and color.  Please check your skin once per month between visits. You can use a small mirror in front and a large mirror behind you to keep an eye on the back side or your body.   If you see any new or changing lesions before your next follow-up, please call to schedule a visit.  Please continue daily skin protection including broad spectrum sunscreen SPF 30+ to sun-exposed areas, reapplying every 2 hours as needed when you're outdoors.    Recommend taking Heliocare sun protection supplement daily in sunny weather for additional sun protection. For maximum protection on the sunniest days, you can take up  to 2 capsules of regular Heliocare OR take 1 capsule of Heliocare Ultra. For prolonged exposure (such as a full day in the sun), you can repeat your dose of the supplement 4 hours after your first dose. Heliocare can be purchased at [facility] or at [email or web address].

## 2020-10-02 NOTE — Progress Notes (Signed)
Follow-Up Visit   Subjective  Gary Escobar is a 70 y.o. male who presents for the following: FBSE (Patient here for full body skin exam and skin cancer screening. Patient with hx of dysplastic nevi. He is not aware of any new or changing spots. ).  Patient does have a spot at left face that is sometimes itchy.   The following portions of the chart were reviewed this encounter and updated as appropriate:   Tobacco  Allergies  Meds  Problems  Med Hx  Surg Hx  Fam Hx      Review of Systems:  No other skin or systemic complaints except as noted in HPI or Assessment and Plan.  Objective  Well appearing patient in no apparent distress; mood and affect are within normal limits.  A full examination was performed including scalp, head, eyes, ears, nose, lips, neck, chest, axillae, abdomen, back, buttocks, bilateral upper extremities, bilateral lower extremities, hands, feet, fingers, toes, fingernails, and toenails. All findings within normal limits unless otherwise noted below.  Left upper arm 0.3cm irregular dark brown thin papule   Right nasofacial angle 0.4cm skin colored papule with prominent non-arborizing telangiectasias without features suspicious for malignancy on dermoscopy    Left preauricular Erythematous keratotic or waxy stuck-on papule or plaque.    Assessment & Plan  Neoplasm of uncertain behavior of skin (2) Left upper arm  Epidermal / dermal shaving  Lesion diameter (cm):  0.3 Informed consent: discussed and consent obtained   Timeout: patient name, date of birth, surgical site, and procedure verified   Patient was prepped and draped in usual sterile fashion: area prepped with isopropyl alcohol. Anesthesia: the lesion was anesthetized in a standard fashion   Anesthetic:  1% lidocaine w/ epinephrine 1-100,000 buffered w/ 8.4% NaHCO3 Instrument used: flexible razor blade   Hemostasis achieved with: aluminum chloride   Outcome: patient tolerated procedure  well   Post-procedure details: wound care instructions given   Additional details:  Mupirocin and a bandage applied  Specimen 1 - Surgical pathology Differential Diagnosis: R/o Atypia  Check Margins: No 0.3cm irregular dark brown thin papule  Right nasofacial angle  Right nasofacial angle c/w benign-appearing nevus. Will monitor.   Inflamed seborrheic keratosis Left preauricular  Prior to procedure, discussed risks of blister formation, small wound, skin dyspigmentation, or rare scar following cryotherapy. Recommend Vaseline ointment to treated areas while healing.   Destruction of lesion - Left preauricular  Destruction method: cryotherapy   Informed consent: discussed and consent obtained   Lesion destroyed using liquid nitrogen: Yes   Cryotherapy cycles:  2 Outcome: patient tolerated procedure well with no complications   Post-procedure details: wound care instructions given    Lentigines - Scattered tan macules - Due to sun exposure - Benign-appering, observe - Recommend daily broad spectrum sunscreen SPF 30+ to sun-exposed areas, reapply every 2 hours as needed. - Call for any changes  Seborrheic Keratoses - Stuck-on, waxy, tan-brown papules and/or plaques  - Benign-appearing - Discussed benign etiology and prognosis. - Observe - Call for any changes  Melanocytic Nevi - Tan-brown and/or pink-flesh-colored symmetric macules and papules - Benign appearing on exam today - Observation - Call clinic for new or changing moles - Recommend daily use of broad spectrum spf 30+ sunscreen to sun-exposed areas.   Hemangiomas - Red papules - Discussed benign nature - Observe - Call for any changes  Actinic Damage - Chronic condition, secondary to cumulative UV/sun exposure - diffuse scaly erythematous macules with underlying dyspigmentation -  Recommend daily broad spectrum sunscreen SPF 30+ to sun-exposed areas, reapply every 2 hours as needed.  - Staying in the  shade or wearing long sleeves, sun glasses (UVA+UVB protection) and wide brim hats (4-inch brim around the entire circumference of the hat) are also recommended for sun protection.  - Call for new or changing lesions.  Skin cancer screening performed today.  History of Dysplastic Nevi - No evidence of recurrence today - Recommend regular full body skin exams - Recommend daily broad spectrum sunscreen SPF 30+ to sun-exposed areas, reapply every 2 hours as needed.  - Call if any new or changing lesions are noted between office visits  Return in about 1 year (around 10/02/2021) for TBSE.  Graciella Belton, RMA, am acting as scribe for Forest Gleason, MD .  Documentation: I have reviewed the above documentation for accuracy and completeness, and I agree with the above.  Forest Gleason, MD

## 2020-10-08 ENCOUNTER — Telehealth: Payer: Self-pay

## 2020-10-08 NOTE — Telephone Encounter (Signed)
-----   Message from Alfonso Patten, MD sent at 10/06/2020  3:05 PM EDT ----- Skin , left upper arm DYSPLASTIC COMPOUND NEVUS WITH MILD ATYPIA, CLOSE TO MARGIN  This is a MILDLY ATYPICAL MOLE. On the spectrum from normal mole to melanoma skin cancer, this is in between but it is much closer to a normal mole.  - These typically do not progress to melanoma or cause any trouble.  - People who have a history of atypical moles do have a slightly increased risk of developing melanoma somewhere on the body, so a yearly full body skin exam by a dermatologist is recommended.  - Monthly self skin checks and daily sun protection are also recommended.  - Please call if you notice a dark spot coming back where this biopsy was taken.  - Please also call if you notice any new or changing spots anywhere else on the body before your follow-up visit.  - Please call our office or send [place] a message if you have any questions or concerns about this biopsy result.   MAs please call. Thank you!

## 2020-10-08 NOTE — Telephone Encounter (Signed)
Patient advised bx showed dysplastic nevus with mild atypia.Gary Escobar

## 2020-10-31 DIAGNOSIS — K5909 Other constipation: Secondary | ICD-10-CM | POA: Insufficient documentation

## 2020-10-31 DIAGNOSIS — K5730 Diverticulosis of large intestine without perforation or abscess without bleeding: Secondary | ICD-10-CM | POA: Insufficient documentation

## 2021-04-08 ENCOUNTER — Encounter: Payer: Self-pay | Admitting: Internal Medicine

## 2021-04-09 ENCOUNTER — Encounter: Payer: Self-pay | Admitting: Internal Medicine

## 2021-04-09 ENCOUNTER — Ambulatory Visit
Admission: RE | Admit: 2021-04-09 | Discharge: 2021-04-09 | Disposition: A | Discharge status: Home / Self Care | Payer: Medicare PPO | Attending: Internal Medicine | Admitting: Internal Medicine

## 2021-04-09 ENCOUNTER — Ambulatory Visit: Payer: Medicare PPO | Admitting: Certified Registered Nurse Anesthetist

## 2021-04-09 ENCOUNTER — Encounter
Admission: RE | Disposition: A | Discharge status: Home / Self Care | Payer: Self-pay | Source: Home / Self Care | Attending: Internal Medicine

## 2021-04-09 DIAGNOSIS — K5730 Diverticulosis of large intestine without perforation or abscess without bleeding: Secondary | ICD-10-CM | POA: Insufficient documentation

## 2021-04-09 DIAGNOSIS — Z86010 Personal history of colonic polyps: Secondary | ICD-10-CM | POA: Insufficient documentation

## 2021-04-09 DIAGNOSIS — Z79899 Other long term (current) drug therapy: Secondary | ICD-10-CM | POA: Insufficient documentation

## 2021-04-09 DIAGNOSIS — D128 Benign neoplasm of rectum: Secondary | ICD-10-CM | POA: Insufficient documentation

## 2021-04-09 DIAGNOSIS — D120 Benign neoplasm of cecum: Secondary | ICD-10-CM | POA: Insufficient documentation

## 2021-04-09 DIAGNOSIS — I4892 Unspecified atrial flutter: Secondary | ICD-10-CM | POA: Diagnosis not present

## 2021-04-09 DIAGNOSIS — R194 Change in bowel habit: Secondary | ICD-10-CM | POA: Insufficient documentation

## 2021-04-09 DIAGNOSIS — K640 First degree hemorrhoids: Secondary | ICD-10-CM | POA: Diagnosis not present

## 2021-04-09 DIAGNOSIS — Z7901 Long term (current) use of anticoagulants: Secondary | ICD-10-CM | POA: Insufficient documentation

## 2021-04-09 HISTORY — DX: Cardiac arrhythmia, unspecified: I49.9

## 2021-04-09 HISTORY — PX: COLONOSCOPY: SHX5424

## 2021-04-09 SURGERY — COLONOSCOPY
Anesthesia: General

## 2021-04-09 MED ORDER — PROPOFOL 10 MG/ML IV BOLUS
INTRAVENOUS | Status: DC | PRN
  Administered 2021-04-09: 60 mg via INTRAVENOUS
  Administered 2021-04-09: 10 mg via INTRAVENOUS

## 2021-04-09 MED ORDER — PROPOFOL 500 MG/50ML IV EMUL
INTRAVENOUS | Status: DC | PRN
  Administered 2021-04-09: 140 ug/kg/min via INTRAVENOUS

## 2021-04-09 MED ORDER — SODIUM CHLORIDE 0.9 % IV SOLN: INTRAVENOUS | Status: DC

## 2021-04-09 MED ORDER — PROPOFOL 500 MG/50ML IV EMUL
INTRAVENOUS | Status: AC
Start: 2021-04-09 — End: ?
  Filled 2021-04-09: qty 50

## 2021-04-09 MED ORDER — LIDOCAINE HCL (CARDIAC) PF 100 MG/5ML IV SOSY
PREFILLED_SYRINGE | INTRAVENOUS | Status: DC | PRN
Start: 2021-04-09 — End: 2021-04-09
  Administered 2021-04-09: 50 mg via INTRAVENOUS

## 2021-04-09 NOTE — Transfer of Care (Signed)
Immediate Anesthesia Transfer of Care Note ? ?Patient: Gary Escobar ? ?Procedure(s) Performed: COLONOSCOPY ? ?Patient Location: Endoscopy Unit ? ?Anesthesia Type:General ? ?Level of Consciousness: drowsy ? ?Airway & Oxygen Therapy: Patient Spontanous Breathing ? ?Post-op Assessment: Report given to RN and Post -op Vital signs reviewed and stable ? ?Post vital signs: Reviewed and stable ? ?Last Vitals:  ?Vitals Value Taken Time  ?BP 90/72   ?Temp    ?Pulse 93 04/09/21 0926  ?Resp 15 04/09/21 0926  ?SpO2 96 % 04/09/21 0926  ?Vitals shown include unvalidated device data. ? ?Last Pain:  ?Vitals:  ? 04/09/21 0810  ?TempSrc: Temporal  ?PainSc: 0-No pain  ?   ? ?  ? ?Complications: No notable events documented. ?

## 2021-04-09 NOTE — H&P (Signed)
?Outpatient short stay form Pre-procedure ?04/09/2021 9:01 AM ?Lorie Apley K. Alice Reichert, M.D. ? ?Primary Physician: Tracie Harrier, M.D. ? ?Reason for visit:  Change in bowel habits ? ?History of present illness:  Mr. Gramling is a pleasant 71 year old male with the following complaint: ? ?Loose stools noted last summer (2021)  regular daily bowel movements. He has experienced no change in the frequency of stool and denies any nocturnal stooling. No hematochezia.  Stomach gurgles loudly a lot after eating.  He has changed his diet removing nuts and trail mix and the gurgling has decreased.  Patient wants to be sure everything is okay.  No history of lactose maldigestion. ?Colonoscopy performed on 11/08/2014 by Dr. Vira Agar revealed benign hyperplastic polyp and mild sigmoid diverticulosis. Otherwise unremarkable. ?Patient would like some investigation for his change in bowel habits. Patient describes "normal" bowel habits with a normal sausage type consistency but intermittently will have "sticky" stools which can be ribbon shaped and associated with a sensation of incomplete defecation. Patient had no new medications starting at the time of symptoms in the summer 2021. Patient does, however, take Cardizem CD 240 mg daily for the last 3 years. This is known to cause or exacerbate constipation symptoms in many patients. Patient also takes Eliquis daily for history of atrial flutter. Denies any chest pain, palpitations or shortness of breath. No significant peripheral edema. ?Patient is being followed by Dr. Wynona Neat (Urology) for history of hematuria. ? ? ? ?Current Facility-Administered Medications:  ?  0.9 %  sodium chloride infusion, , Intravenous, Continuous, [place], Benay Pike, MD, Last Rate: 20 mL/hr at 04/09/21 0900, Continued from Pre-op at 04/09/21 0900 ? ?Medications Prior to Admission  ?Medication Sig Dispense Refill Last Dose  ? ALBUTEROL IN Inhale 90 mcg into the lungs as needed.     ? diltiazem (CARDIZEM CD) 240 MG  24 hr capsule Take 240 mg by mouth daily.   04/08/2021  ? Flaxseed, Linseed, (FLAXSEED OIL PO) Take by mouth 2 (two) times a week. Meal / Mix with yogurt   Past Week  ? GLUCOSAMINE-CHONDROITIN PO Take by mouth daily.   Past Week  ? Multiple Vitamin (MULTIVITAMIN) tablet Take 1 tablet by mouth daily. Gnc   Past Week  ? Multiple Vitamins-Minerals (MEGA MULTI MEN PO) Take 1 tablet by mouth daily.   Past Week  ? omega-3 acid ethyl esters (LOVAZA) 1 g capsule Take 1 g by mouth daily.   Past Week  ? Turmeric 500 MG CAPS Take 500 mg by mouth daily.   Past Week  ? ELIQUIS 5 MG TABS tablet Take 5 mg by mouth 2 (two) times daily.    04/05/2021 at 1845  ? tadalafil (CIALIS) 5 MG tablet TAKE 1-2 TABLETS BY MOUTH DAILY AS NEEDED FOR ERECTILE DYSFUNCTION 30 tablet 11   ? ? ? ?No Known Allergies ? ? ?Past Medical History:  ?Diagnosis Date  ? Arrhythmia   ? 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 Borderline hypertension   ? Dysplastic nevus 09/27/2019  ? Left lateral thigh. Moderate atypia, limited margins free.   ? Dysplastic nevus 04/30/2020  ? R mid back, mild atypia  ? Dysrhythmia 2019  ? History of dysplastic nevus 10/02/2020  ? left upper arm, mild  ? History of kidney stones 2001  ? Hyperlipidemia   ? Hypertension   ? Oral thrush 04/17/2014  ? Plantar fasciitis   ? Positive cardiac stress test   ? ? ?Review of systems:  Otherwise negative.  ? ? ?  Physical Exam ? ?Gen: Alert, oriented. Appears stated age.  ?HEENT: /AT. PERRLA. ?Lungs: CTA, no wheezes. ?CV: RR nl S1, S2. ?Abd: soft, benign, no masses. BS+ ?Ext: No edema. Pulses 2+ ? ? ? ?Planned procedures: Proceed with colonoscopy. The patient understands the nature of the planned procedure, indications, risks, alternatives and potential complications including but not limited to bleeding, infection, perforation, damage to internal organs and possible oversedation/side effects from anesthesia. The patient agrees and gives consent to proceed.  ?Please refer to procedure notes for findings,  recommendations and patient disposition/instructions.  ? ? ? ?Ravina Milner K. Alice Reichert, M.D. ?Gastroenterology ?04/09/2021  9:01 AM ? ? ? ? ? ?

## 2021-04-09 NOTE — Anesthesia Procedure Notes (Signed)
Date/Time: 04/09/2021 9:04 AM ?Performed by: Lily Peer, Qusai Kem, CRNA ?Pre-anesthesia Checklist: Patient identified, Emergency Drugs available, Patient being monitored, Timeout performed and Suction available ?Patient Re-evaluated:Patient Re-evaluated prior to induction ?Oxygen Delivery Method: Nasal cannula ?Induction Type: IV induction ? ? ? ? ?

## 2021-04-09 NOTE — Interval H&P Note (Signed)
History and Physical Interval Note: ? ?04/09/2021 ?9:02 AM ? ?Gary Escobar  has presented today for surgery, with the diagnosis of Change in bowel habits (R19.4) ?Diverticulosis of colon without hemorrhage (K57.30).  The various methods of treatment have been discussed with the patient and family. After consideration of risks, benefits and other options for treatment, the patient has consented to  Procedure(s): ?COLONOSCOPY (N/A) as a surgical intervention.  The patient's history has been reviewed, patient examined, no change in status, stable for surgery.  I have reviewed the patient's chart and labs.  Questions were answered to the patient's satisfaction.   ? ? ?[place], [place] ? ? ?

## 2021-04-09 NOTE — Interval H&P Note (Signed)
History and Physical Interval Note: ? ?04/09/2021 ?9:02 AM ? ?Gary Escobar  has presented today for surgery, with the diagnosis of Change in bowel habits (R19.4) ?Diverticulosis of colon without hemorrhage (K57.30).  The various methods of treatment have been discussed with the patient and family. After consideration of risks, benefits and other options for treatment, the patient has consented to  Procedure(s): ?COLONOSCOPY (N/A) as a surgical intervention.  The patient's history has been reviewed, patient examined, no change in status, stable for surgery.  I have reviewed the patient's chart and labs.  Questions were answered to the patient's satisfaction.   ? ? ?[place], [place] ? ? ?

## 2021-04-09 NOTE — Anesthesia Preprocedure Evaluation (Signed)
Anesthesia Evaluation  ?Patient identified by MRN, date of birth, ID band ?Patient awake ? ? ? ?Reviewed: ?Allergy & Precautions, NPO status , Patient's Chart, lab work & pertinent test results ? ?History of Anesthesia Complications ?Negative for: history of anesthetic complications ? ?Airway ?Mallampati: III ? ?TM Distance: >3 FB ?Neck ROM: full ? ? ? Dental ? ?(+) Chipped, Missing ?  ?Pulmonary ?neg pulmonary ROS, neg shortness of breath,  ?  ?Pulmonary exam normal ? ? ? ? ? ? ? Cardiovascular ?Exercise Tolerance: Good ?hypertension, (-) angina+ dysrhythmias Atrial Fibrillation  ? ? ?  ?Neuro/Psych ?negative neurological ROS ? negative psych ROS  ? GI/Hepatic ?negative GI ROS, Neg liver ROS,   ?Endo/Other  ?negative endocrine ROS ? Renal/GU ?negative Renal ROS  ?negative genitourinary ?  ?Musculoskeletal ? ?(+) Arthritis ,  ? Abdominal ?  ?Peds ? Hematology ?negative hematology ROS ?(+)   ?Anesthesia Other Findings ?Past Medical History: ?No date: Arrhythmia ?No date: Arthritis ?    Comment:  hip,  ?No date: Borderline hypertension ?09/27/2019: Dysplastic nevus ?    Comment:  Left lateral thigh. Moderate atypia, limited margins  ?             free.  ?04/30/2020: Dysplastic nevus ?    Comment:  R mid back, mild atypia ?2019: Dysrhythmia ?10/02/2020: History of dysplastic nevus ?    Comment:  left upper arm, mild ?2001: History of kidney stones ?No date: Hyperlipidemia ?No date: Hypertension ?04/17/2014: Oral thrush ?No date: Plantar fasciitis ?No date: Positive cardiac stress test ? ?Past Surgical History: ?No date: COLONOSCOPY ?11/08/2014: COLONOSCOPY WITH PROPOFOL; N/A ?    Comment:  Procedure: COLONOSCOPY WITH PROPOFOL;  Surgeon: Herbie Baltimore T ?             Vira Agar, MD;  Location: [place];  Service:  ?             Endoscopy;  Laterality: N/A; ?No date: JOINT REPLACEMENT ?No date: right knee arthroscopy; Right ?No date: TONSILLECTOMY ?12/20/2019: TOTAL HIP ARTHROPLASTY; Left ?     Comment:  Procedure: TOTAL HIP ARTHROPLASTY ANTERIOR APPROACH;   ?             Surgeon: Gaynelle Arabian, MD;  Location: [facility];  Service: ?             Orthopedics;  Laterality: Left;  110mn ? ?BMI   ? Body Mass Index: 31.17 kg/m?  ?  ? ? Reproductive/Obstetrics ?negative OB ROS ? ?  ? ? ? ? ? ? ? ? ? ? ? ? ? ?  ?  ? ? ? ? ? ? ? ? ?Anesthesia Physical ?Anesthesia Plan ? ?ASA: 3 ? ?Anesthesia Plan: General  ? ?Post-op Pain Management:   ? ?Induction: Intravenous ? ?PONV Risk Score and Plan: Propofol infusion and TIVA ? ?Airway Management Planned: Natural Airway and Nasal Cannula ? ?Additional Equipment:  ? ?Intra-op Plan:  ? ?Post-operative Plan:  ? ?Informed Consent: I have reviewed the patients History and Physical, chart, labs and discussed the procedure including the risks, benefits and alternatives for the proposed anesthesia with the patient or authorized representative who has indicated his/her understanding and acceptance.  ? ? ? ?Dental Advisory Given ? ?Plan Discussed with: Anesthesiologist, CRNA and Surgeon ? ?Anesthesia Plan Comments: (Patient consented for risks of anesthesia including but not limited to:  ?- adverse reactions to medications ?- risk of airway placement if required ?- damage to eyes, teeth, lips or other oral mucosa ?- nerve damage  due to positioning  ?- sore throat or hoarseness ?- Damage to heart, brain, nerves, lungs, other parts of body or loss of life ? ?Patient voiced understanding.)  ? ? ? ? ? ? ?Anesthesia Quick Evaluation ? ?

## 2021-04-09 NOTE — Anesthesia Postprocedure Evaluation (Signed)
Anesthesia Post Note ? ?Patient: Gary Escobar ? ?Procedure(s) Performed: COLONOSCOPY ? ?Patient location during evaluation: Endoscopy ?Anesthesia Type: General ?Level of consciousness: awake and alert ?Pain management: pain level controlled ?Vital Signs Assessment: post-procedure vital signs reviewed and stable ?Respiratory status: spontaneous breathing, nonlabored ventilation, respiratory function stable and patient connected to nasal cannula oxygen ?Cardiovascular status: blood pressure returned to baseline and stable ?Postop Assessment: no apparent nausea or vomiting ?Anesthetic complications: no ? ? ?No notable events documented. ? ? ?Last Vitals:  ?Vitals:  ? 04/09/21 0940 04/09/21 0949  ?BP:  106/81  ?Pulse: 88 95  ?Resp: 20 (!) 24  ?Temp:    ?SpO2: 96% 97%  ?  ?Last Pain:  ?Vitals:  ? 04/09/21 0810  ?TempSrc: Temporal  ?PainSc: 0-No pain  ? ? ?  ?  ?  ?  ?  ?  ? ?Precious Haws Shanena Pellegrino ? ? ? ? ?

## 2021-04-09 NOTE — Op Note (Signed)
[facility] ?Gastroenterology ?Patient Name: Gary Escobar ?Procedure Date: 04/09/2021 9:01 AM ?MRN: [id number] ?Account #: [id number] ?Date of Birth: [birth year] ?Admit Type: Outpatient ?Age: 71 ?Room: [facility] ENDO ROOM 2 ?Gender: Male ?Note Status: Finalized ?Instrument Name: Colonoscope [id number] ?Procedure:             Colonoscopy ?Indications:           Change in bowel habits, Constipation ?Providers:             Benay Pike. Everli Rother MD, MD ?Medicines:             Propofol per Anesthesia ?Complications:         No immediate complications. ?Procedure:             Pre-Anesthesia Assessment: ?                       - The risks and benefits of the procedure and the  ?                       sedation options and risks were discussed with the  ?                       patient. All questions were answered and informed  ?                       consent was obtained. ?                       - Patient identification and proposed procedure were  ?                       verified prior to the procedure by the nurse. The  ?                       procedure was verified in the procedure room. ?                       - ASA Grade Assessment: II - A patient with mild  ?                       systemic disease. ?                       - After reviewing the risks and benefits, the patient  ?                       was deemed in satisfactory condition to undergo the  ?                       procedure. ?                       After obtaining informed consent, the colonoscope was  ?                       passed under direct vision. Throughout the procedure,  ?                       the patient's blood pressure, pulse, and oxygen  ?  saturations were monitored continuously. The  ?                       Colonoscope was introduced through the anus and  ?                       advanced to the the cecum, identified by appendiceal  ?                       orifice and ileocecal valve. The colonoscopy was  ?                        performed without difficulty. The patient tolerated  ?                       the procedure well. The quality of the bowel  ?                       preparation was good. The ileocecal valve, appendiceal  ?                       orifice, and rectum were photographed. ?Findings: ?     The perianal and digital rectal examinations were normal. Pertinent  ?     negatives include normal sphincter tone and no palpable rectal lesions. ?     A 4 mm polyp was found in the rectum. The polyp was sessile. The polyp  ?     was removed with a jumbo cold forceps. Resection and retrieval were  ?     complete. ?     A 11 mm polyp was found in the cecum. The polyp was sessile. The polyp  ?     was removed with a piecemeal technique using a hot snare. Resection and  ?     retrieval were complete. To prevent bleeding after the polypectomy, one  ?     hemostatic clip was successfully placed (MR conditional). There was no  ?     bleeding during, or at the end, of the procedure. ?     A few small-mouthed diverticula were found in the sigmoid colon. ?     Non-bleeding internal hemorrhoids were found during retroflexion. The  ?     hemorrhoids were Grade I (internal hemorrhoids that do not prolapse). ?     The exam was otherwise without abnormality. ?Impression:            - One 4 mm polyp in the rectum, removed with a jumbo  ?                       cold forceps. Resected and retrieved. ?                       - One 11 mm polyp in the cecum, removed piecemeal  ?                       using a hot snare. Resected and retrieved. Clip (MR  ?                       conditional) was placed. ?                       -  Diverticulosis in the sigmoid colon. ?                       - Non-bleeding internal hemorrhoids. ?                       - The examination was otherwise normal. ?Recommendation:        - Patient has a contact number available for  ?                       emergencies. The signs and symptoms of potential  ?                        delayed complications were discussed with the patient.  ?                       Return to normal activities tomorrow. Written  ?                       discharge instructions were provided to the patient. ?                       - High fiber diet. ?                       - Continue present medications. ?                       - Resume Eliquis (apixaban) at prior dose today. Refer  ?                       to managing physician for further adjustment of  ?                       therapy. ?                       - Repeat colonoscopy is recommended for surveillance.  ?                       The colonoscopy date will be determined after  ?                       pathology results from today's exam become available  ?                       for review. ?                       - Return to GI office PRN. ?                       - The findings and recommendations were discussed with  ?                       the patient. ?Procedure Code(s):     --- Professional --- ?                       [phone], Colonoscopy, flexible; with removal of  ?  tumor(s), polyp(s), or other lesion(s) by snare  ?                       technique ?                       45380, 59, Colonoscopy, flexible; with biopsy, single  ?                       or multiple ?Diagnosis Code(s):     --- Professional --- ?                       K57.30, Diverticulosis of large intestine without  ?                       perforation or abscess without bleeding ?                       K59.00, Constipation, unspecified ?                       R19.4, Change in bowel habit ?                       K64.0, First degree hemorrhoids ?                       K63.5, Polyp of colon ?                       K62.1, Rectal polyp ?CPT copyright 2019 [employer]. All rights reserved. ?The codes documented in this report are preliminary and upon coder review may  ?be revised to meet current compliance requirements. ?Efrain Sella MD, MD ?04/09/2021 9:28:14  AM ?This report has been signed electronically. ?Number of Addenda: 0 ?Note Initiated On: 04/09/2021 9:01 AM ?Scope Withdrawal Time: 0 hours 7 minutes 57 seconds  ?Total Procedure Duration: 0 hours 12 minutes 37 seconds  ?Estimated Blood Loss:  Estimated blood loss: none. ?     [facility] ?

## 2021-04-10 ENCOUNTER — Encounter: Payer: Self-pay | Admitting: Internal Medicine

## 2021-04-10 LAB — SURGICAL PATHOLOGY

## 2021-07-17 ENCOUNTER — Ambulatory Visit: Payer: Medicare PPO | Admitting: Urology

## 2021-07-17 ENCOUNTER — Encounter: Payer: Self-pay | Admitting: Urology

## 2021-07-17 VITALS — BP 131/90 | HR 102 | Ht 68.0 in | Wt 207.0 lb

## 2021-07-17 DIAGNOSIS — R399 Unspecified symptoms and signs involving the genitourinary system: Secondary | ICD-10-CM

## 2021-07-17 DIAGNOSIS — N401 Enlarged prostate with lower urinary tract symptoms: Secondary | ICD-10-CM

## 2021-07-17 DIAGNOSIS — N529 Male erectile dysfunction, unspecified: Secondary | ICD-10-CM | POA: Diagnosis not present

## 2021-07-17 DIAGNOSIS — R3129 Other microscopic hematuria: Secondary | ICD-10-CM

## 2021-07-17 DIAGNOSIS — R3121 Asymptomatic microscopic hematuria: Secondary | ICD-10-CM | POA: Diagnosis not present

## 2021-07-17 MED ORDER — TADALAFIL 5 MG PO TABS: ORAL_TABLET | ORAL | 11 refills | Status: DC

## 2021-07-17 NOTE — Patient Instructions (Signed)

## 2021-07-17 NOTE — Progress Notes (Signed)
   07/17/2021 2:06 PM   Gary Escobar Mar 30, 1950 [id number]  Reason for visit: Follow up PSA screening, ED, BPH, history of microscopic hematuria  HPI: 71 year old male with long history of microscopic hematuria and multiple negative work-ups previously.  Most recently he underwent a CT urogram in June 2021 which was benign, and he deferred cystoscopy at that time secondary to his multiple negative work-ups previously.  Most recent urinalysis with PCP on 04/14/2021 showed few squamous cells, moderate bacteria, 4-10 RBCs, 4-10 WBCs, small leukocyte esterase, nitrite negative, and culture was negative.  We reviewed options including full work-up with CT and cystoscopy, renal/bladder ultrasound, or cystoscopy alone.  He is very averse to cystoscopy based on his prior experience with cystoscopy, and using shared decision making he opted for a renal/bladder ultrasound for further evaluation of his chronic low-grade microscopic hematuria.  He denies any gross hematuria.  We have had him on Cialis 5 mg daily for both the ED and BPH, and this is working well for him.  He states that improves his urinary symptoms.    Finally, in terms of PSA screening, PSA remains stable at 0.7 and well within the normal range.  We again had a discussion about the AUA guidelines that do not recommend routine screening in men over age 35, but he would like to continue yearly screening.  He otherwise is relatively healthy and I think this is not unreasonable.  Cialis refilled for BPH and ED Renal/bladder ultrasound ordered for chronic low-grade microscopic hematuria, call with results RTC 1 year with PSA prior   Gary Co, MD  [facility] [address], [place], [place] [address] [phone]

## 2021-07-29 ENCOUNTER — Ambulatory Visit
Admission: RE | Admit: 2021-07-29 | Discharge: 2021-07-29 | Disposition: A | Discharge status: Home / Self Care | Payer: Medicare PPO | Source: Ambulatory Visit | Attending: Urology | Admitting: Urology

## 2021-07-29 DIAGNOSIS — R3129 Other microscopic hematuria: Secondary | ICD-10-CM | POA: Diagnosis present

## 2021-09-25 ENCOUNTER — Ambulatory Visit: Payer: Medicare PPO | Admitting: Dermatology

## 2021-09-25 DIAGNOSIS — L821 Other seborrheic keratosis: Secondary | ICD-10-CM

## 2021-09-25 DIAGNOSIS — Z86018 Personal history of other benign neoplasm: Secondary | ICD-10-CM | POA: Diagnosis not present

## 2021-09-25 DIAGNOSIS — Z1283 Encounter for screening for malignant neoplasm of skin: Secondary | ICD-10-CM | POA: Diagnosis not present

## 2021-09-25 DIAGNOSIS — L814 Other melanin hyperpigmentation: Secondary | ICD-10-CM | POA: Diagnosis not present

## 2021-09-25 DIAGNOSIS — L578 Other skin changes due to chronic exposure to nonionizing radiation: Secondary | ICD-10-CM

## 2021-09-25 DIAGNOSIS — L570 Actinic keratosis: Secondary | ICD-10-CM

## 2021-09-25 DIAGNOSIS — L738 Other specified follicular disorders: Secondary | ICD-10-CM

## 2021-09-25 DIAGNOSIS — D1800 Hemangioma unspecified site: Secondary | ICD-10-CM

## 2021-09-25 DIAGNOSIS — L820 Inflamed seborrheic keratosis: Secondary | ICD-10-CM

## 2021-09-25 DIAGNOSIS — D229 Melanocytic nevi, unspecified: Secondary | ICD-10-CM

## 2021-09-25 NOTE — Progress Notes (Signed)
Follow-Up Visit   Subjective  Gary Escobar is a 71 y.o. male who presents for the following: Annual Exam (The patient presents for Total-Body Skin Exam (TBSE) for skin cancer screening and mole check.  The patient has spots, moles and lesions to be evaluated, some may be new or changing and the patient has concerns that these could be cancer. Patient with hx of dysplastic nevi. ).  Patient does have some residual ISK at left preauricular that was treated previously with LN2.  The following portions of the chart were reviewed this encounter and updated as appropriate:   Tobacco  Allergies  Meds  Problems  Med Hx  Surg Hx  Fam Hx      Review of Systems:  No other skin or systemic complaints except as noted in HPI or Assessment and Plan.  Objective  Well appearing patient in no apparent distress; mood and affect are within normal limits.  A full examination was performed including scalp, head, eyes, ears, nose, lips, neck, chest, axillae, abdomen, back, buttocks, bilateral upper extremities, bilateral lower extremities, hands, feet, fingers, toes, fingernails, and toenails. All findings within normal limits unless otherwise noted below.  Left Preauricular Erythematous stuck-on, waxy papule or plaque  forehead Small yellow papules with a central dell.   right occipital scalp x 1 Erythematous thin papules/macules with gritty scale.     Assessment & Plan  Inflamed seborrheic keratosis Left Preauricular  Symptomatic, irritating, patient would like treated.  Prior to procedure, discussed risks of blister formation, small wound, skin dyspigmentation, or rare scar following cryotherapy. Recommend Vaseline ointment to treated areas while healing.    Destruction of lesion - Left Preauricular  Destruction method: cryotherapy   Informed consent: discussed and consent obtained   Lesion destroyed using liquid nitrogen: Yes   Cryotherapy cycles:  2 Outcome: patient tolerated  procedure well with no complications   Post-procedure details: wound care instructions given    Sebaceous hyperplasia forehead  Benign-appearing.  Observation.  Call clinic for new or changing lesions.    AK (actinic keratosis) right occipital scalp x 1  Actinic keratoses are precancerous spots that appear secondary to cumulative UV radiation exposure/sun exposure over time. They are chronic with expected duration over 1 year. A portion of actinic keratoses will progress to squamous cell carcinoma of the skin. It is not possible to reliably predict which spots will progress to skin cancer and so treatment is recommended to prevent development of skin cancer.  Recommend daily broad spectrum sunscreen SPF 30+ to sun-exposed areas, reapply every 2 hours as needed.  Recommend staying in the shade or wearing long sleeves, sun glasses (UVA+UVB protection) and wide brim hats (4-inch brim around the entire circumference of the hat). Call for new or changing lesions.  Prior to procedure, discussed risks of blister formation, small wound, skin dyspigmentation, or rare scar following cryotherapy. Recommend Vaseline ointment to treated areas while healing.   Destruction of lesion - right occipital scalp x 1  Destruction method: cryotherapy   Informed consent: discussed and consent obtained   Lesion destroyed using liquid nitrogen: Yes   Cryotherapy cycles:  2 Outcome: patient tolerated procedure well with no complications   Post-procedure details: wound care instructions given     History of Dysplastic Nevi - No evidence of recurrence today - Recommend regular full body skin exams - Recommend daily broad spectrum sunscreen SPF 30+ to sun-exposed areas, reapply every 2 hours as needed.  - Call if any new or changing  lesions are noted between office visits  Lentigines - Scattered tan macules - Due to sun exposure - Benign-appearing, observe - Recommend daily broad spectrum sunscreen SPF 30+  to sun-exposed areas, reapply every 2 hours as needed. - Call for any changes  Seborrheic Keratoses - Stuck-on, waxy, tan-brown papules and/or plaques  - Benign-appearing - Discussed benign etiology and prognosis. - Observe - Call for any changes  Melanocytic Nevi - Tan-brown and/or pink-flesh-colored symmetric macules and papules - Benign appearing on exam today - Observation - Call clinic for new or changing moles - Recommend daily use of broad spectrum spf 30+ sunscreen to sun-exposed areas.   Hemangiomas - Red papules - Discussed benign nature - Observe - Call for any changes  Actinic Damage - Chronic condition, secondary to cumulative UV/sun exposure - diffuse scaly erythematous macules with underlying dyspigmentation - Recommend daily broad spectrum sunscreen SPF 30+ to sun-exposed areas, reapply every 2 hours as needed.  - Staying in the shade or wearing long sleeves, sun glasses (UVA+UVB protection) and wide brim hats (4-inch brim around the entire circumference of the hat) are also recommended for sun protection.  - Call for new or changing lesions.  Skin cancer screening performed today.  Return in about 1 year (around 09/26/2022) for TBSE.  Graciella Belton, RMA, am acting as scribe for Forest Gleason, MD .  Documentation: I have reviewed the above documentation for accuracy and completeness, and I agree with the above.  Forest Gleason, MD

## 2021-09-25 NOTE — Patient Instructions (Addendum)
Cryotherapy Aftercare  Wash gently with soap and water everyday.   Apply Vaseline and Band-Aid daily until healed.   Recommend taking Heliocare sun protection supplement daily in sunny weather for additional sun protection. For maximum protection on the sunniest days, you can take up to 2 capsules of regular Heliocare OR take 1 capsule of Heliocare Ultra. For prolonged exposure (such as a full day in the sun), you can repeat your dose of the supplement 4 hours after your first dose. Heliocare can be purchased at [facility], at some [facility] or at www.heliocare.com.    Melanoma ABCDEs  Melanoma is the most dangerous type of skin cancer, and is the leading cause of death from skin disease.  You are more likely to develop melanoma if you: Have light-colored skin, light-colored eyes, or red or blond hair Spend a lot of time in the sun Tan regularly, either outdoors or in a tanning bed Have had blistering sunburns, especially during childhood Have a close family member who has had a melanoma Have atypical moles or large birthmarks  Early detection of melanoma is key since treatment is typically straightforward and cure rates are extremely high if we catch it early.   The first sign of melanoma is often a change in a mole or a new dark spot.  The ABCDE system is a way of remembering the signs of melanoma.  A for asymmetry:  The two halves do not match. B for border:  The edges of the growth are irregular. C for color:  A mixture of colors are present instead of an even brown color. D for diameter:  Melanomas are usually (but not always) greater than 6mm - the size of a pencil eraser. E for evolution:  The spot keeps changing in size, shape, and color.  Please check your skin once per month between visits. You can use a small mirror in front and a large mirror behind you to keep an eye on the back side or your body.   If you see any new or changing lesions before your next follow-up,  please call to schedule a visit.  Please continue daily skin protection including broad spectrum sunscreen SPF 30+ to sun-exposed areas, reapplying every 2 hours as needed when you're outdoors.    Due to recent changes in healthcare laws, you may see results of your pathology and/or laboratory studies on MyChart before the doctors have had a chance to review them. We understand that in some cases there may be results that are confusing or concerning to you. Please understand that not all results are received at the same time and often the doctors may need to interpret multiple results in order to provide you with the best plan of care or course of treatment. Therefore, we ask that you please give us 2 business days to thoroughly review all your results before contacting the office for clarification. Should we see a critical lab result, you will be contacted sooner.   If You Need Anything After Your Visit  If you have any questions or concerns for your doctor, please call our main line at [phone] and press option 4 to reach your doctor's medical assistant. If no one answers, please leave a voicemail as directed and we will return your call as soon as possible. Messages left after 4 pm will be answered the following business day.   You may also send us a message via MyChart. We typically respond to MyChart messages within 1-2 business days.    For prescription refills, please ask your pharmacy to contact our office. Our fax number is [phone].  If you have an urgent issue when the clinic is closed that cannot wait until the next business day, you can page your doctor at the number below.    Please note that while we do our best to be available for urgent issues outside of office hours, we are not available 24/7.   If you have an urgent issue and are unable to reach us, you may choose to seek medical care at your doctor's office, retail clinic, urgent care center, or emergency room.  If you  have a medical emergency, please immediately call 911 or go to the emergency department.  Pager Numbers  - Dr. Kowalski: [phone]  - Dr. Moye: [phone]  - Dr. Stewart: [phone]  In the event of inclement weather, please call our main line at [phone] for an update on the status of any delays or closures.  Dermatology Medication Tips: Please keep the boxes that topical medications come in in order to help keep track of the instructions about where and how to use these. Pharmacies typically print the medication instructions only on the boxes and not directly on the medication tubes.   If your medication is too expensive, please contact our office at [phone] option 4 or send us a message through MyChart.   We are unable to tell what your co-pay for medications will be in advance as this is different depending on your insurance coverage. However, we may be able to find a substitute medication at lower cost or fill out paperwork to get insurance to cover a needed medication.   If a prior authorization is required to get your medication covered by your insurance company, please allow us 1-2 business days to complete this process.  Drug prices often vary depending on where the prescription is filled and some pharmacies may offer cheaper prices.  The website www.goodrx.com contains coupons for medications through different pharmacies. The prices here do not account for what the cost may be with help from insurance (it may be cheaper with your insurance), but the website can give you the price if you did not use any insurance.  - You can print the associated coupon and take it with your prescription to the pharmacy.  - You may also stop by our office during regular business hours and pick up a GoodRx coupon card.  - If you need your prescription sent electronically to a different pharmacy, notify our office through [facility] MyChart or by phone at [phone] option  4.     Si Usted Necesita Algo Despus de Su Visita  Tambin puede enviarnos un mensaje a travs de MyChart. Por lo general respondemos a los mensajes de MyChart en el transcurso de 1 a 2 das hbiles.  Para renovar recetas, por favor pida a su farmacia que se ponga en contacto con nuestra oficina. Nuestro nmero de fax es el [phone].  Si tiene un asunto urgente cuando la clnica est cerrada y que no puede esperar hasta el siguiente da hbil, puede llamar/localizar a su doctor(a) al nmero que aparece a continuacin.   Por favor, tenga en cuenta que aunque hacemos todo lo posible para estar disponibles para asuntos urgentes fuera del horario de oficina, no estamos disponibles las 24 horas del da, los 7 das de la semana.   Si tiene un problema urgente y no puede comunicarse con nosotros, puede optar por buscar atencin mdica  en   el consultorio de su doctor(a), en una clnica privada, en un centro de atencin urgente o en una sala de emergencias.  Si tiene una emergencia mdica, por favor llame inmediatamente al 911 o vaya a la sala de emergencias.  Nmeros de bper  - Dr. Kowalski: [phone]  - Dra. Moye: [phone]  - Dra. Stewart: [phone]  En caso de inclemencias del tiempo, por favor llame a nuestra lnea principal al [phone] para una actualizacin sobre el estado de cualquier retraso o cierre.  Consejos para la medicacin en dermatologa: Por favor, guarde las cajas en las que vienen los medicamentos de uso tpico para ayudarle a seguir las instrucciones sobre dnde y cmo usarlos. Las farmacias generalmente imprimen las instrucciones del medicamento slo en las cajas y no directamente en los tubos del medicamento.   Si su medicamento es muy caro, por favor, pngase en contacto con nuestra oficina llamando al [phone] y presione la opcin 4 o envenos un mensaje a travs de MyChart.   No podemos decirle cul ser su copago por los medicamentos por  adelantado ya que esto es diferente dependiendo de la cobertura de su seguro. Sin embargo, es posible que podamos encontrar un medicamento sustituto a menor costo o llenar un formulario para que el seguro cubra el medicamento que se considera necesario.   Si se requiere una autorizacin previa para que su compaa de seguros cubra su medicamento, por favor permtanos de 1 a 2 das hbiles para completar este proceso.  Los precios de los medicamentos varan con frecuencia dependiendo del lugar de dnde se surte la receta y alguna farmacias pueden ofrecer precios ms baratos.  El sitio web www.goodrx.com tiene cupones para medicamentos de diferentes farmacias. Los precios aqu no tienen en cuenta lo que podra costar con la ayuda del seguro (puede ser ms barato con su seguro), pero el sitio web puede darle el precio si no utiliz ningn seguro.  - Puede imprimir el cupn correspondiente y llevarlo con su receta a la farmacia.  - Tambin puede pasar por nuestra oficina durante el horario de atencin regular y recoger una tarjeta de cupones de GoodRx.  - Si necesita que su receta se enve electrnicamente a una farmacia diferente, informe a nuestra oficina a travs de MyChart de [facility] o por telfono llamando al [phone] y presione la opcin 4.  

## 2021-09-29 ENCOUNTER — Encounter: Payer: Self-pay | Admitting: Dermatology

## 2021-10-02 ENCOUNTER — Encounter: Payer: Medicare PPO | Admitting: Dermatology

## 2021-10-22 ENCOUNTER — Telehealth: Payer: Self-pay

## 2021-10-22 NOTE — Telephone Encounter (Signed)
-----   Message from Billey Co, MD sent at 10/22/2021 10:51 AM EDT ----- Regarding: UA from PCP Please schedule clinic visit with UA and culture, atypical cultures, and review outside urine results  Nickolas Madrid, MD 10/22/2021   ----- Message ----- From: Noralyn Pick Sent: 10/22/2021  10:10 AM EDT To: Billey Co, MD  Dr. Linton Ham office requested that the patient call our office to have you review his recent urinalysis.  Please advise if an appointment needs to be scheduled

## 2021-10-22 NOTE — Telephone Encounter (Signed)
Called and lvm for pt advising him that we had to change his  appt time to 4pm. Left detailed vm for patient regarding this ,informing patient that if this time didn't work pt will need call us back to r/s.

## 2021-10-22 NOTE — Telephone Encounter (Signed)
Called pt regarding his UA results from [place] , per Dr Donnetta Hail wanted to see pt in office. Made him an appt. For 10/29/21 at 245pm.

## 2021-10-29 ENCOUNTER — Ambulatory Visit: Payer: Medicare PPO | Admitting: Urology

## 2021-10-29 ENCOUNTER — Encounter: Payer: Self-pay | Admitting: Urology

## 2021-10-29 VITALS — BP 124/85 | HR 121 | Ht 68.0 in | Wt 204.0 lb

## 2021-10-29 DIAGNOSIS — N401 Enlarged prostate with lower urinary tract symptoms: Secondary | ICD-10-CM

## 2021-10-29 DIAGNOSIS — R3121 Asymptomatic microscopic hematuria: Secondary | ICD-10-CM

## 2021-10-29 DIAGNOSIS — N138 Other obstructive and reflux uropathy: Secondary | ICD-10-CM

## 2021-10-29 DIAGNOSIS — R8281 Pyuria: Secondary | ICD-10-CM | POA: Diagnosis not present

## 2021-10-29 DIAGNOSIS — N529 Male erectile dysfunction, unspecified: Secondary | ICD-10-CM | POA: Diagnosis not present

## 2021-10-29 LAB — MICROSCOPIC EXAMINATION

## 2021-10-29 LAB — URINALYSIS, COMPLETE
Bilirubin, UA: NEGATIVE
Glucose, UA: NEGATIVE
Ketones, UA: NEGATIVE
Nitrite, UA: NEGATIVE
Protein,UA: NEGATIVE
Specific Gravity, UA: 1.03 (ref 1.005–1.030)
Urobilinogen, Ur: 0.2 mg/dL (ref 0.2–1.0)
pH, UA: 5 (ref 5.0–7.5)

## 2021-10-29 NOTE — Progress Notes (Signed)
   10/29/2021 4:15 PM   Gary Escobar [birth year] [id number]  Reason for visit: Follow up pyuria, microscopic hematuria, BPH, ED, PSA screening  HPI: 71 year old male I have followed for the above issues.  He has a long history of microscopic hematuria with multiple negative work-ups previously.  He has deferred cystoscopy secondary to significant discomfort with that procedure previously in the past.  Most recently, he had a renal/bladder ultrasound on 07/29/2021, and PVR was normal at 29m.  He was referred over for recent urinalysis showing pyuria with 28 WBC, 4 RBC, no bacteria, 2+ leuk esterase, nitrite negative.  Culture was negative.  He has had multiple urine samples in the last 18 months with similar findings, and cultures have been negative.  He continues to deny any urinary symptoms.  He is on Cialis daily for both mild BPH and ED with good results.  Urinalysis today is pending, and will send for culture and atypical cultures.  Suspect this is benign mild pyuria and microscopic hematuria in the setting of his extensive prior negative work-ups and absence of symptoms.  We again reviewed cystoscopy and he would like to hold off at this time which I think is reasonable.  Could also consider a repeat CT urogram, but recent normal renal/bladder ultrasound is reassuring.  Follow-up urine culture and atypical culture, if positive will treat with 3 to 4 weeks Onyx for possible subacute prostatitis.  If negative can continue yearly follow-up with PVR    BBilley Co MD  [facility] [place] [phone]

## 2021-11-01 LAB — CULTURE, URINE COMPREHENSIVE

## 2021-11-05 LAB — MYCOPLASMA / UREAPLASMA CULTURE
Mycoplasma hominis Culture: NEGATIVE
Ureaplasma urealyticum: NEGATIVE

## 2022-07-16 ENCOUNTER — Ambulatory Visit: Payer: Medicare PPO | Admitting: Urology

## 2022-07-16 ENCOUNTER — Encounter: Payer: Self-pay | Admitting: Urology

## 2022-07-16 VITALS — BP 122/71 | HR 88 | Ht 68.0 in | Wt 204.0 lb

## 2022-07-16 DIAGNOSIS — N529 Male erectile dysfunction, unspecified: Secondary | ICD-10-CM | POA: Diagnosis not present

## 2022-07-16 DIAGNOSIS — N401 Enlarged prostate with lower urinary tract symptoms: Secondary | ICD-10-CM

## 2022-07-16 DIAGNOSIS — R3121 Asymptomatic microscopic hematuria: Secondary | ICD-10-CM | POA: Diagnosis not present

## 2022-07-16 DIAGNOSIS — N138 Other obstructive and reflux uropathy: Secondary | ICD-10-CM

## 2022-07-16 LAB — BLADDER SCAN AMB NON-IMAGING

## 2022-07-16 MED ORDER — TADALAFIL 5 MG PO TABS: ORAL_TABLET | ORAL | 3 refills | Status: DC

## 2022-07-16 NOTE — Progress Notes (Signed)
   07/16/2022 1:07 PM   Gary Escobar 08-03-1950 [id number]  Reason for visit: Follow up microscopic hematuria, pyuria, ED, PSA screening  HPI: 72 year old healthy male with a long history of asymptomatic microscopic hematuria with multiple prior negative workups.  He underwent cystoscopy with Dr. Achilles Dunk previously which was negative, and he had a fair amount of discomfort from the procedure.  He had a repeat CT urogram with me in 2021 but deferred cystoscopy at that time.  He also has a history of mild pyuria, and cultures have always been negative.  We have previously offered repeat cystoscopy and he has deferred.  He denies any major changes in his urination over the last year, no problems with nocturia, denies any dysuria or gross hematuria.  PVR today normal at 8ml.  He had a urinalysis with PCP in March 2024 with 3 RBC and 17 WBC, culture was again negative.  PSA was normal at 0.93.  He has had extensive workup of his chronic asymptomatic microscopic hematuria as well as sterile pyuria that has been negative, we discussed the low, but not 0, risk of missing additional pathology by deferring repeat cystoscopy.  He would like to avoid cystoscopy, but return precautions of new urinary symptoms or gross hematuria discussed extensively.  Cialis continues to work well for his ED, and he continues on a daily 5 mg dose.  Cialis refilled RTC 1 year PVR  Sondra Come, MD  [facility] [address] [place], [place] [address] [phone]

## 2022-09-09 ENCOUNTER — Encounter: Payer: Self-pay | Admitting: Dermatology

## 2022-09-09 ENCOUNTER — Ambulatory Visit (INDEPENDENT_AMBULATORY_CARE_PROVIDER_SITE_OTHER): Payer: Medicare PPO | Admitting: Dermatology

## 2022-09-09 ENCOUNTER — Encounter: Payer: Medicare PPO | Admitting: Dermatology

## 2022-09-09 VITALS — BP 117/67 | HR 86

## 2022-09-09 DIAGNOSIS — L814 Other melanin hyperpigmentation: Secondary | ICD-10-CM

## 2022-09-09 DIAGNOSIS — W908XXA Exposure to other nonionizing radiation, initial encounter: Secondary | ICD-10-CM

## 2022-09-09 DIAGNOSIS — L821 Other seborrheic keratosis: Secondary | ICD-10-CM | POA: Diagnosis not present

## 2022-09-09 DIAGNOSIS — Z1283 Encounter for screening for malignant neoplasm of skin: Secondary | ICD-10-CM | POA: Diagnosis not present

## 2022-09-09 DIAGNOSIS — L578 Other skin changes due to chronic exposure to nonionizing radiation: Secondary | ICD-10-CM | POA: Diagnosis not present

## 2022-09-09 DIAGNOSIS — D492 Neoplasm of unspecified behavior of bone, soft tissue, and skin: Secondary | ICD-10-CM

## 2022-09-09 DIAGNOSIS — D229 Melanocytic nevi, unspecified: Secondary | ICD-10-CM

## 2022-09-09 DIAGNOSIS — Z86018 Personal history of other benign neoplasm: Secondary | ICD-10-CM

## 2022-09-09 DIAGNOSIS — Z872 Personal history of diseases of the skin and subcutaneous tissue: Secondary | ICD-10-CM

## 2022-09-09 DIAGNOSIS — D1801 Hemangioma of skin and subcutaneous tissue: Secondary | ICD-10-CM

## 2022-09-09 DIAGNOSIS — L738 Other specified follicular disorders: Secondary | ICD-10-CM

## 2022-09-09 DIAGNOSIS — L905 Scar conditions and fibrosis of skin: Secondary | ICD-10-CM

## 2022-09-09 NOTE — Progress Notes (Signed)
Follow-Up Visit   Subjective  Gary Escobar is a 72 y.o. male who presents for the following: Skin Cancer Screening and Full Body Skin Exam. Hx of dysplastic nevi. 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  Areas of concern on left side of face, left calf, left eyebrow. Dark spots.   The patient presents for Total-Body Skin Exam (TBSE) for skin cancer screening and mole check. The patient has spots, moles and lesions to be evaluated, some may be new or changing and the patient may have concern these could be cancer.    The following portions of the chart were reviewed this encounter and updated as appropriate: medications, allergies, medical history  Review of Systems:  No other skin or systemic complaints except as noted in HPI or Assessment and Plan.  Objective  Well appearing patient in no apparent distress; mood and affect are within normal limits.  A full examination was performed including scalp, head, eyes, ears, nose, lips, neck, chest, axillae, abdomen, back, buttocks, bilateral upper extremities, bilateral lower extremities, hands, feet, fingers, toes, fingernails, and toenails. All findings within normal limits unless otherwise noted below.   Relevant physical exam findings are noted in the Assessment and Plan.  Right Extensor Forearm 5 mm pink scaly papule          Assessment & Plan   HISTORY OF DYSPLASTIC NEVI No evidence of recurrence today Recommend regular full body skin exams Recommend daily broad spectrum sunscreen SPF 30+ to sun-exposed areas, reapply every 2 hours as needed.  Call if any new or changing lesions are noted between office visits   SKIN CANCER SCREENING PERFORMED TODAY.  ACTINIC DAMAGE - Chronic condition, secondary to cumulative UV/sun exposure - diffuse scaly erythematous macules with underlying dyspigmentation - Recommend daily broad spectrum sunscreen SPF 30+ to sun-exposed areas, reapply every 2 hours as needed.  - Staying in the shade or wearing long  sleeves, sun glasses (UVA+UVB protection) and wide brim hats (4-inch brim around the entire circumference of the hat) are also recommended for sun protection.  - Call for new or changing lesions.  LENTIGINES, SEBORRHEIC KERATOSES, HEMANGIOMAS - Benign normal skin lesions - Benign-appearing - Call for any changes  MELANOCYTIC NEVI - Tan-brown and/or pink-flesh-colored symmetric macules and papules - Benign appearing on exam today - Observation - Call clinic for new or changing moles - Recommend daily use of broad spectrum spf 30+ sunscreen to sun-exposed areas.    SEBORRHEIC KERATOSIS - Stuck-on, waxy, tan-brown papule at right posterior calf.  - Benign-appearing - Discussed benign etiology and prognosis. - Observe - Call for any changes  Sebaceous Hyperplasia - Small yellow papules with a central dell at face. - Benign-appearing - Observe. Call for changes.   Favor Sebaceous Hyperplasia > BCC -  R paranasal cheek pink papule with telangiectasias.  - Benign-appearing - Observe. Call for changes.   Neoplasm of skin Right Extensor Forearm  Skin / nail biopsy Type of biopsy: tangential   Informed consent: discussed and consent obtained   Anesthesia: the lesion was anesthetized in a standard fashion   Anesthesia comment:  Area prepped with alcohol Anesthetic:  1% lidocaine w/ epinephrine 1-100,000 buffered w/ 8.4% NaHCO3 Instrument used: DermaBlade   Hemostasis achieved with: pressure and aluminum chloride   Outcome: patient tolerated procedure well   Post-procedure details: wound care instructions given   Post-procedure details comment:  Ointment and small bandage applied  Specimen 1 - Surgical pathology Differential Diagnosis: BCC vs DF vs AK vs SCC  Check Margins: No   Return in about 1 year (around 09/09/2023) for TBSE, HxDN.  I, Lawson Radar, CMA, am acting as scribe for Elie Goody, MD.   Documentation: I have reviewed the above documentation for  accuracy and completeness, and I agree with the above.  Elie Goody, MD

## 2022-09-09 NOTE — Patient Instructions (Addendum)
Wound Care Instructions  Cleanse wound gently with soap and water once a day then pat dry with clean gauze. Apply a thin coat of Petrolatum (petroleum jelly, "Vaseline") over the wound (unless you have an allergy to this). We recommend that you use a new, sterile tube of Vaseline. Do not pick or remove scabs. Do not remove the yellow or white "healing tissue" from the base of the wound.  Cover the wound with fresh, clean, nonstick gauze and secure with paper tape. You may use Band-Aids in place of gauze and tape if the wound is small enough, but would recommend trimming much of the tape off as there is often too much. Sometimes Band-Aids can irritate the skin.  You should call the office for your biopsy report after 1 week if you have not already been contacted.  If you experience any problems, such as abnormal amounts of bleeding, swelling, significant bruising, significant pain, or evidence of infection, please call the office immediately.  FOR ADULT SURGERY PATIENTS: If you need something for pain relief you may take 1 extra strength Tylenol (acetaminophen) AND 2 Ibuprofen (200mg  each) together every 4 hours as needed for pain. (do not take these if you are allergic to them or if you have a reason you should not take them.) Typically, you may only need pain medication for 1 to 3 days.      Recommend daily broad spectrum sunscreen SPF 30+ to sun-exposed areas, reapply every 2 hours as needed. Call for new or changing lesions.  Staying in the shade or wearing long sleeves, sun glasses (UVA+UVB protection) and wide brim hats (4-inch brim around the entire circumference of the hat) are also recommended for sun protection.    Melanoma ABCDEs  Melanoma is the most dangerous type of skin cancer, and is the leading cause of death from skin disease.  You are more likely to develop melanoma if you: Have light-colored skin, light-colored eyes, or red or blond hair Spend a lot of time in the sun Tan  regularly, either outdoors or in a tanning bed Have had blistering sunburns, especially during childhood Have a close family member who has had a melanoma Have atypical moles or large birthmarks  Early detection of melanoma is key since treatment is typically straightforward and cure rates are extremely high if we catch it early.   The first sign of melanoma is often a change in a mole or a new dark spot.  The ABCDE system is a way of remembering the signs of melanoma.  A for asymmetry:  The two halves do not match. B for border:  The edges of the growth are irregular. C for color:  A mixture of colors are present instead of an even brown color. D for diameter:  Melanomas are usually (but not always) greater than 6mm - the size of a pencil eraser. E for evolution:  The spot keeps changing in size, shape, and color.  Please check your skin once per month between visits. You can use a small mirror in front and a large mirror behind you to keep an eye on the back side or your body.   If you see any new or changing lesions before your next follow-up, please call to schedule a visit.  Please continue daily skin protection including broad spectrum sunscreen SPF 30+ to sun-exposed areas, reapplying every 2 hours as needed when you're outdoors.   Staying in the shade or wearing long sleeves, sun glasses (UVA+UVB protection) and wide  brim hats (4-inch brim around the entire circumference of the hat) are also recommended for sun protection.    Due to recent changes in healthcare laws, you may see results of your pathology and/or laboratory studies on MyChart before the doctors have had a chance to review them. We understand that in some cases there may be results that are confusing or concerning to you. Please understand that not all results are received at the same time and often the doctors may need to interpret multiple results in order to provide you with the best plan of care or course of  treatment. Therefore, we ask that you please give [place] 2 business days to thoroughly review all your results before contacting the office for clarification. Should we see a critical lab result, you will be contacted sooner.   If You Need Anything After Your Visit  If you have any questions or concerns for your doctor, please call our main line at [phone] and press option 4 to reach your doctor's medical assistant. If no one answers, please leave a voicemail as directed and we will return your call as soon as possible. Messages left after 4 pm will be answered the following business day.   You may also send [place] a message via MyChart. We typically respond to MyChart messages within 1-2 business days.  For prescription refills, please ask your pharmacy to contact our office. Our fax number is [phone].  If you have an urgent issue when the clinic is closed that cannot wait until the next business day, you can page your doctor at the number below.    Please note that while we do our best to be available for urgent issues outside of office hours, we are not available 24/7.   If you have an urgent issue and are unable to reach [place], you may choose to seek medical care at your doctor's office, retail clinic, urgent care center, or emergency room.  If you have a medical emergency, please immediately call 911 or go to the emergency department.  Pager Numbers  - Dr. Gwen Pounds: [phone]  - Dr. Roseanne Reno: [phone]  In the event of inclement weather, please call our main line at [phone] for an update on the status of any delays or closures.  Dermatology Medication Tips: Please keep the boxes that topical medications come in in order to help keep track of the instructions about where and how to use these. Pharmacies typically print the medication instructions only on the boxes and not directly on the medication tubes.   If your medication is too expensive, please contact our office at  [phone] option 4 or send [place] a message through MyChart.   We are unable to tell what your co-pay for medications will be in advance as this is different depending on your insurance coverage. However, we may be able to find a substitute medication at lower cost or fill out paperwork to get insurance to cover a needed medication.   If a prior authorization is required to get your medication covered by your insurance company, please allow [place] 1-2 business days to complete this process.  Drug prices often vary depending on where the prescription is filled and some pharmacies may offer cheaper prices.  The website www.goodrx.com contains coupons for medications through different pharmacies. The prices here do not account for what the cost may be with help from insurance (it may be cheaper with your insurance), but the website can give you the price if you did  not use any insurance.  - You can print the associated coupon and take it with your prescription to the pharmacy.  - You may also stop by our office during regular business hours and pick up a GoodRx coupon card.  - If you need your prescription sent electronically to a different pharmacy, notify our office through [facility] or by phone at [phone] option 4.     Si Usted Necesita Algo Despus de Su Visita  Tambin puede enviarnos un mensaje a travs de Clinical cytogeneticist. Por lo general respondemos a los mensajes de MyChart en el transcurso de 1 a 2 das hbiles.  Para renovar recetas, por favor pida a su farmacia que se ponga en contacto con nuestra oficina. Annie Sable de fax es [place] [phone].  Si tiene un asunto urgente cuando la clnica est cerrada y que no puede esperar hasta el siguiente da hbil, puede llamar/localizar a su doctor(a) al nmero que aparece a continuacin.   Por favor, tenga en cuenta que aunque hacemos todo lo posible para estar disponibles para asuntos urgentes fuera del horario de [place], no estamos  disponibles las 24 horas del da, los 7 [address]  [address] de la [place].   Si tiene un problema urgente y no puede comunicarse con nosotros, puede optar por buscar atencin mdica  en el consultorio de su doctor(a), en una clnica privada, en un centro de atencin urgente o en una sala de emergencias.  Si tiene Engineer, drilling, por favor llame inmediatamente al 911 o vaya a la sala de emergencias.  Nmeros de bper  - Dr. Gwen Pounds: [phone]  - Dra. Roseanne Reno: [phone]  En caso de inclemencias del [place], por favor llame a Lacy Duverney principal al [phone] para una actualizacin sobre el [place] de cualquier retraso o cierre.  Consejos para la medicacin en dermatologa: Por favor, guarde las cajas en las que vienen los medicamentos de uso tpico para ayudarle a seguir las instrucciones sobre dnde y cmo usarlos. Las farmacias generalmente imprimen las instrucciones del medicamento slo en las cajas y no directamente en los tubos del [place].   Si su medicamento es muy caro, por favor, pngase en contacto con Rolm Gala llamando al [phone] y presione la opcin 4 o envenos un mensaje a travs de Clinical cytogeneticist.   No podemos decirle cul ser su copago por los medicamentos por adelantado ya que esto es diferente dependiendo de la cobertura de su seguro. Sin embargo, es posible que podamos encontrar un medicamento sustituto a Audiological scientist un formulario para que el seguro cubra el medicamento que se considera necesario.   Si se requiere una autorizacin previa para que su compaa de seguros [place] su medicamento, por favor permtanos de 1 a 2 das hbiles para completar [address]  Los precios de los medicamentos varan con frecuencia dependiendo del Environmental consultant de dnde se surte la receta y alguna farmacias pueden ofrecer precios ms baratos.  El sitio web www.goodrx.com tiene cupones para medicamentos de Health and safety inspector. Los precios aqu no tienen en cuenta lo que podra  costar con la ayuda del seguro (puede ser ms barato con su seguro), pero el sitio web puede darle el precio si no utiliz Tourist information centre manager.  - Puede imprimir el cupn correspondiente y llevarlo con su receta a la farmacia.  - Tambin puede pasar por nuestra oficina durante el horario de atencin regular y Education officer, museum una tarjeta de cupones de GoodRx.  - Si necesita que su receta se enve electrnicamente a [employer],  informe a nuestra oficina a travs de MyChart de [place] o por telfono llamando al [phone] y presione la opcin 4.

## 2022-09-19 ENCOUNTER — Telehealth: Payer: Self-pay | Admitting: Dermatology

## 2022-09-19 NOTE — Telephone Encounter (Signed)
Sent to [place]:  This biopsy went to Dr Neale Burly. Please call and tell patient that the biopsy showed a scar, most likely from a past injury. No skin cancer and no treatment needed.

## 2022-09-21 ENCOUNTER — Telehealth: Payer: Self-pay

## 2022-09-21 NOTE — Telephone Encounter (Signed)
Discussed pathology results. Patient voiced understanding.

## 2022-09-25 IMAGING — RF DG HIP (WITH PELVIS) OPERATIVE*L*
1 series · 2 of 2 positions shown · non-contrast
Comparison: pelvis 12/20/2019.

CLINICAL DATA: Left anterior hip replacement

EXAM:
OPERATIVE left HIP (WITH PELVIS IF PERFORMED) 2 VIEWS
TECHNIQUE: Fluoroscopic spot image(s) were submitted for interpretation
post-operatively.

[Series 1: unknown protocol · 0.20mm/px · 2 of 2 slices shown]
[im 1/2]
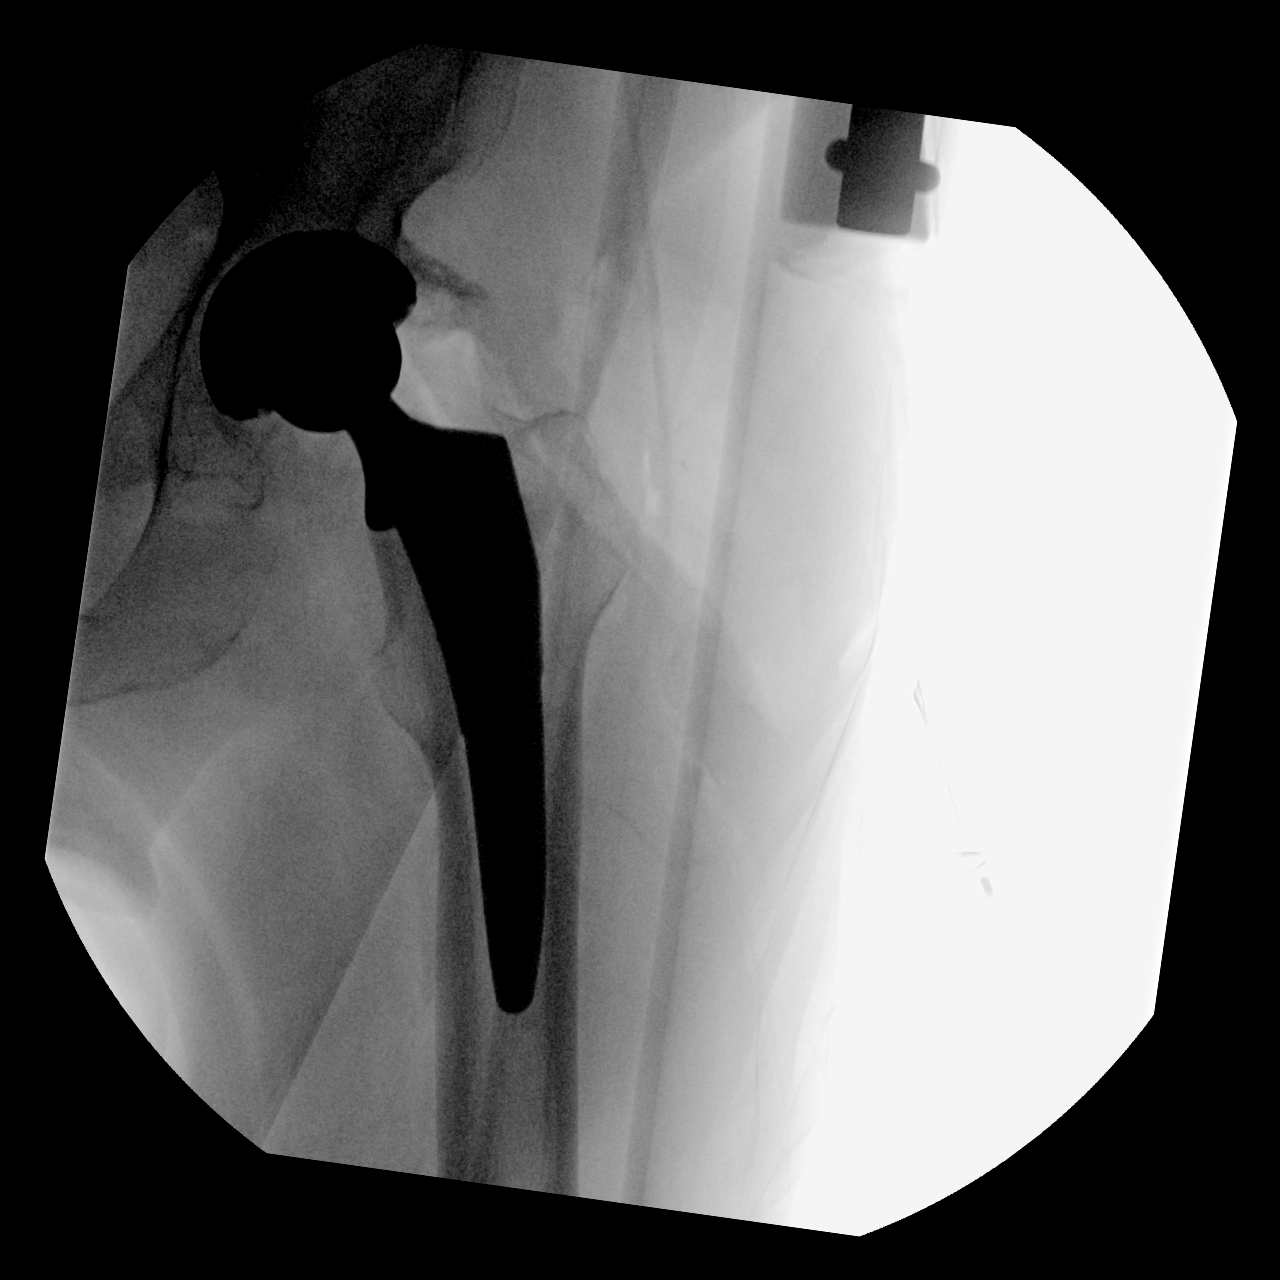
[im 2/2]
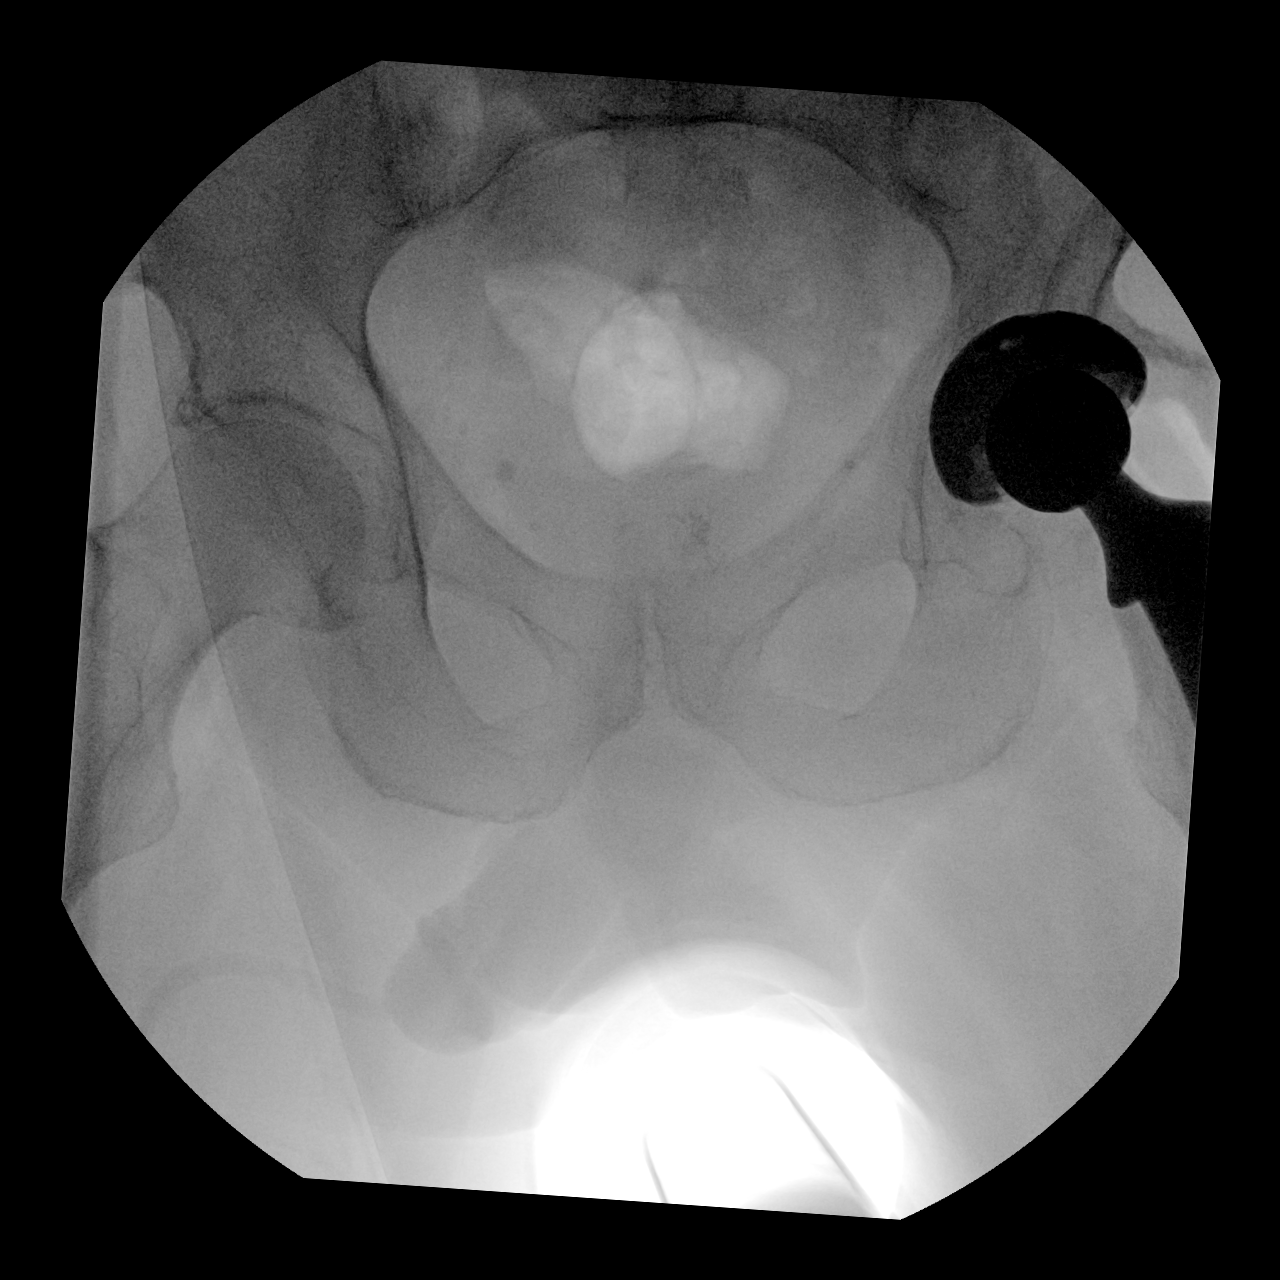

[2 of 2 positions shown; findings below may reference images not displayed]

FINDINGS: Left hip replacement in satisfactory position and alignment. No
fracture or complication.
IMPRESSION: Satisfactory left hip replacement.

## 2022-09-25 IMAGING — RF DG C-ARM 1-60 MIN-NO REPORT
1 series · 2 of 2 positions shown · non-contrast
Comparison: pelvis 12/20/2019.

CLINICAL DATA: Left anterior hip replacement

EXAM:
OPERATIVE left HIP (WITH PELVIS IF PERFORMED) 2 VIEWS
TECHNIQUE: Fluoroscopic spot image(s) were submitted for interpretation
post-operatively.

[Series 1: unknown protocol · 0.20mm/px · 2 of 2 slices shown]
[im 1/2]
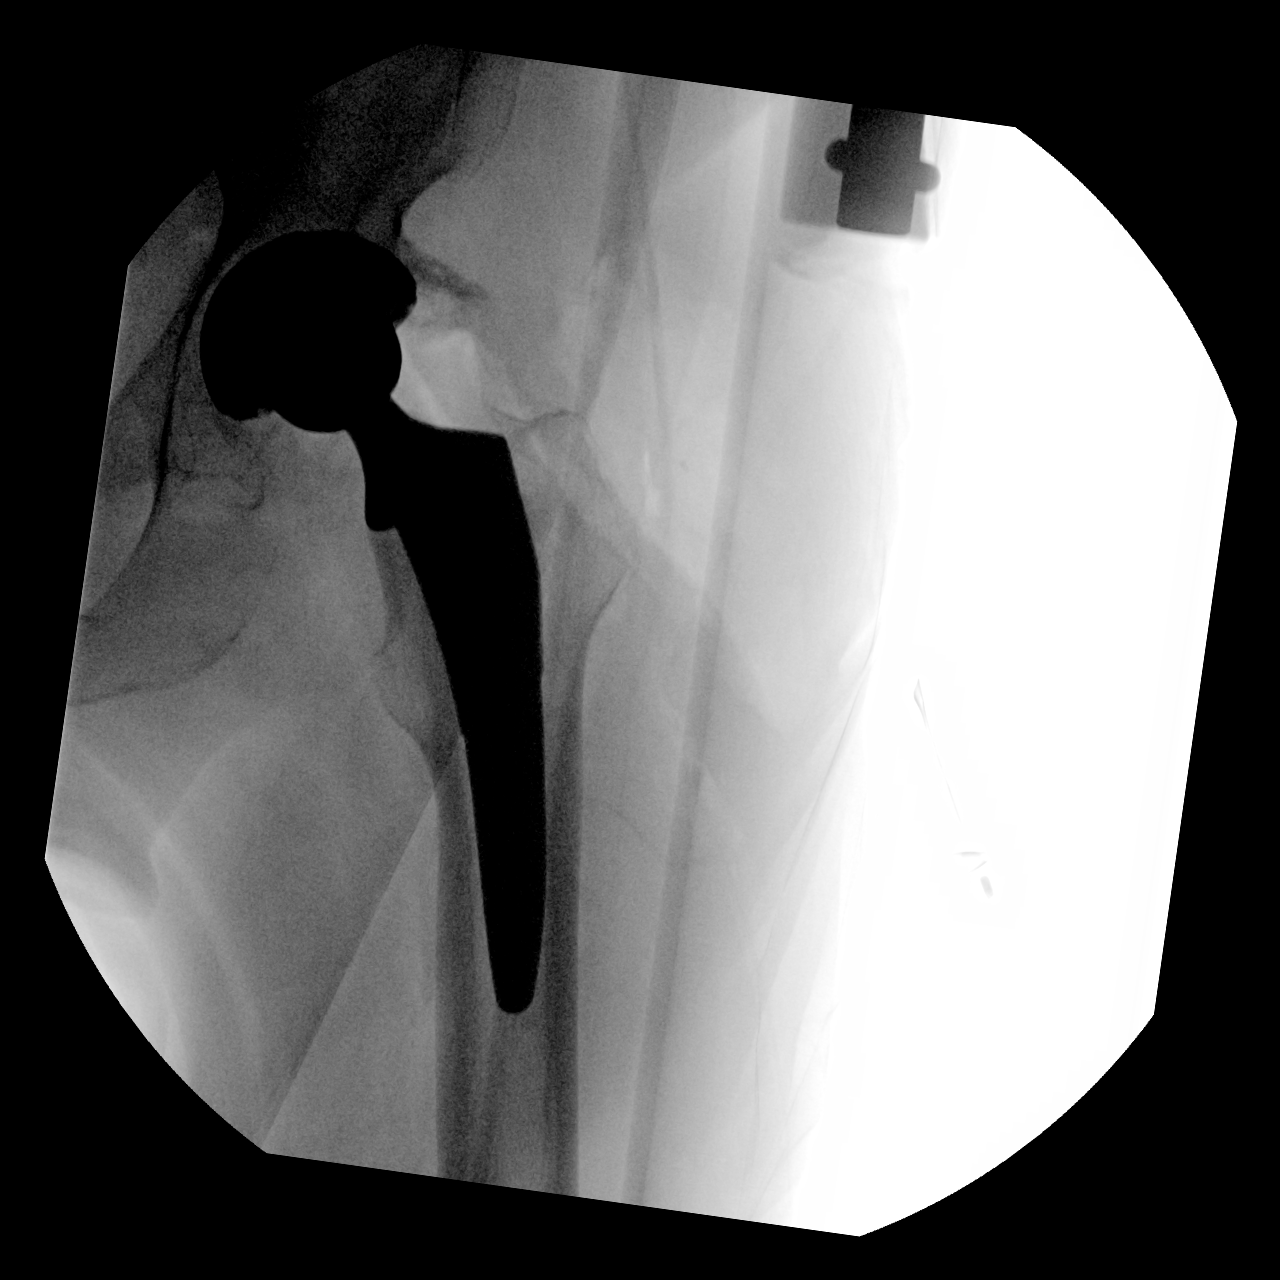
[im 2/2]
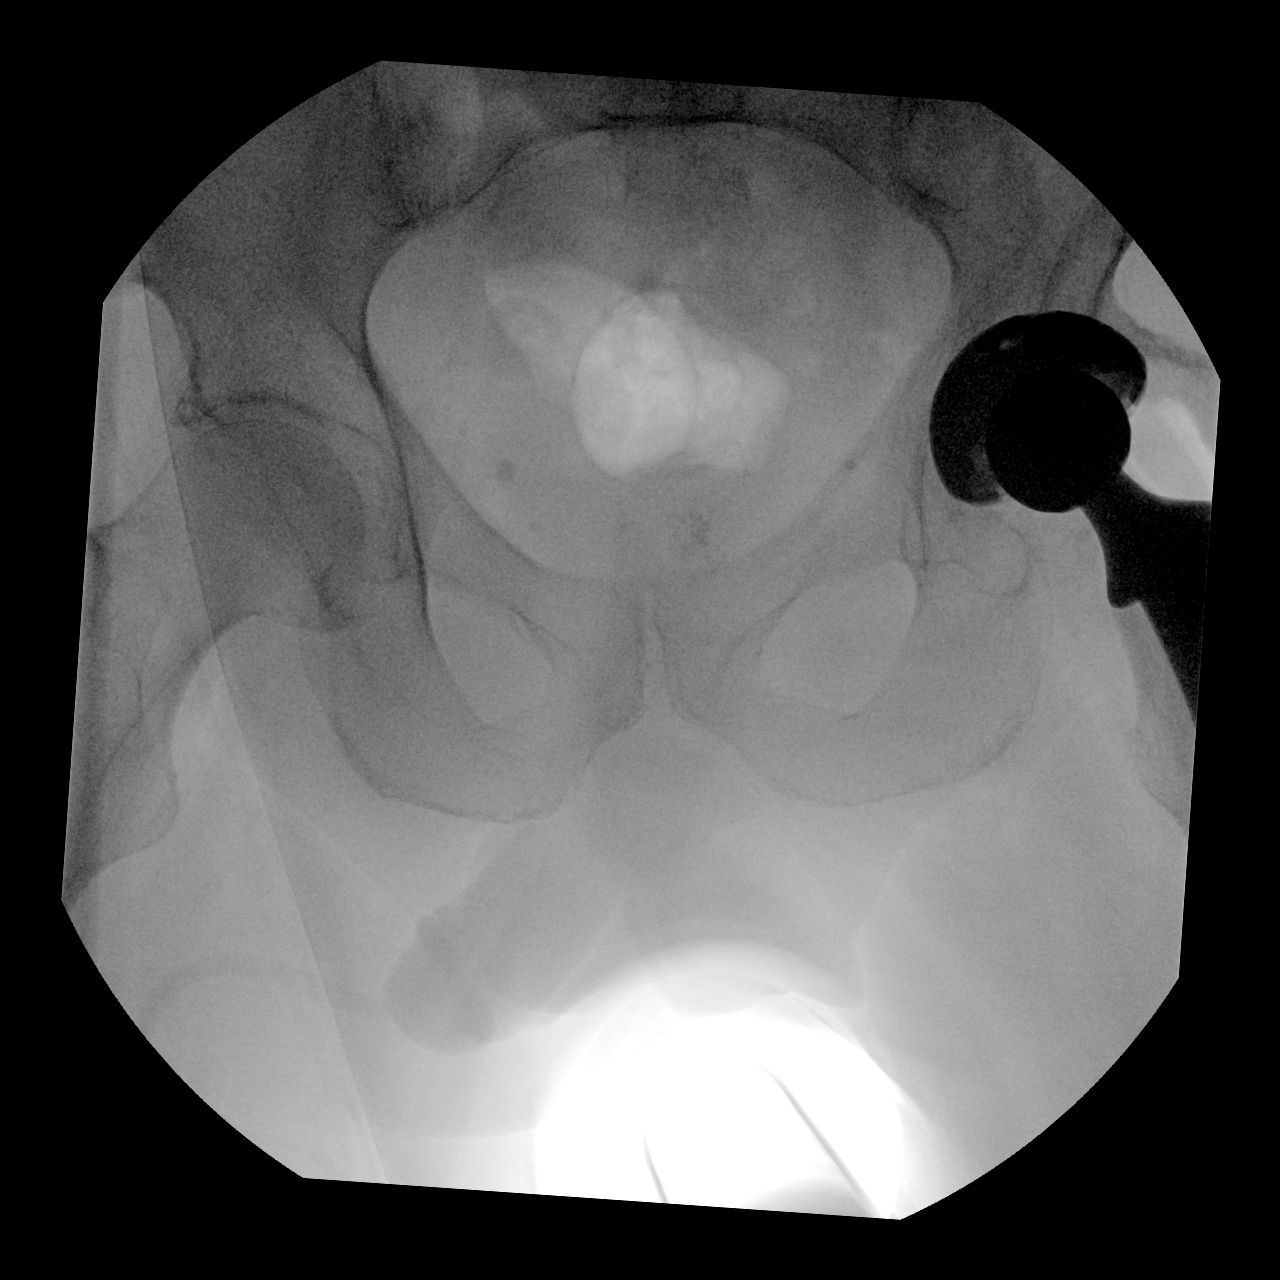

[2 of 2 positions shown; findings below may reference images not displayed]

FINDINGS: Left hip replacement in satisfactory position and alignment. No
fracture or complication.
IMPRESSION: Satisfactory left hip replacement.

## 2023-05-03 DIAGNOSIS — R7309 Other abnormal glucose: Secondary | ICD-10-CM | POA: Insufficient documentation

## 2023-06-25 MED ORDER — TADALAFIL 5 MG PO TABS: ORAL_TABLET | ORAL | 3 refills | Status: DC

## 2023-07-22 ENCOUNTER — Ambulatory Visit: Payer: Self-pay | Admitting: Urology

## 2023-07-22 VITALS — BP 112/74 | HR 69 | Ht 68.0 in | Wt 204.0 lb

## 2023-07-22 DIAGNOSIS — R3121 Asymptomatic microscopic hematuria: Secondary | ICD-10-CM | POA: Diagnosis not present

## 2023-07-22 DIAGNOSIS — N138 Other obstructive and reflux uropathy: Secondary | ICD-10-CM | POA: Diagnosis not present

## 2023-07-22 DIAGNOSIS — N401 Enlarged prostate with lower urinary tract symptoms: Secondary | ICD-10-CM | POA: Diagnosis not present

## 2023-07-22 DIAGNOSIS — N529 Male erectile dysfunction, unspecified: Secondary | ICD-10-CM

## 2023-07-22 LAB — BLADDER SCAN AMB NON-IMAGING

## 2023-07-22 MED ORDER — TADALAFIL 5 MG PO TABS: ORAL_TABLET | ORAL | 3 refills | Status: AC

## 2023-07-22 NOTE — Progress Notes (Signed)
   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  He underwent cystoscopy with Dr. Aram Knights previously which was negative, and he had a fair amount of discomfort from the procedure.  He had a repeat CT urogram with me in 2021 but deferred cystoscopy at that time.  He also has a history of mild pyuria, and cultures have always been negative.  We have previously offered repeat cystoscopy and he has deferred.  He denies any major changes in his urination over the last year, no problems with nocturia, denies any dysuria or gross hematuria.  PVR today normal at 0ml.  He had a urinalysis with PCP in March 2024 with persistent microscopic hematuria and pyuria, culture was again negative.  PSA normal at 2.16 from March 2025.  We again reviewed the AUA guidelines that do not recommend routine screening in men over age 13.  He has had extensive workup of his chronic asymptomatic microscopic hematuria as well as sterile pyuria that has been negative, we discussed the low, but not 0, risk of missing additional pathology by deferring repeat cystoscopy.  He would like to avoid cystoscopy, but return precautions of new urinary symptoms or gross hematuria discussed extensively.  Cialis  continues to work well for his ED, and he continues on a daily 5 mg dose.  Cialis  refilled RTC 1 year   Lawerence Pressman, MD  [facility] [address] [place], [place] [address] [phone]

## 2023-07-22 NOTE — Patient Instructions (Signed)
 Prostate Cancer Screening  Prostate cancer screening is testing that is done to check for the presence of prostate cancer in men. The prostate gland is a walnut-sized gland that is located below the bladder and in front of the rectum in males. The function of the prostate is to add fluid to semen during ejaculation. Prostate cancer is one of the most common types of cancer in men. Who should have prostate cancer screening? Screening recommendations vary based on age and other risk factors, as well as between the professional organizations who make the recommendations. In general, screening is recommended if: You are age 25 to 42 and have an average risk for prostate cancer. You should talk with your health care provider about your need for screening and how often screening should be done. Because most prostate cancers are slow growing and will not cause death, screening in this age group is generally reserved for men who have a 10- to 15-year life expectancy. You are younger than age 40, and you have these risk factors: Having a father, brother, or uncle who has been diagnosed with prostate cancer. The risk is higher if your family member's cancer occurred at an early age or if you have multiple family members with prostate cancer at an early age. Being a male who is [place] or is of [place] or sub-Saharan African descent. In general, screening is not recommended if: You are younger than age 1. You are between the ages of 75 and 63 and you have no risk factors. You are 76 years of age or older. At this age, the risks that screening can cause are greater than the benefits that it may provide. If you are at high risk for prostate cancer, your health care provider may recommend that you have screenings more often or that you start screening at a younger age. How is screening for prostate cancer done? The recommended prostate cancer screening test is a blood test called the prostate-specific antigen  (PSA) test. PSA is a protein that is made in the prostate. As you age, your prostate naturally produces more PSA. Abnormally high PSA levels may be caused by: Prostate cancer. An enlarged prostate that is not caused by cancer (benign prostatic hyperplasia, or BPH). This condition is very common in older men. A prostate gland infection (prostatitis) or urinary tract infection. Certain medicines such as male hormones (like testosterone) or other medicines that raise testosterone levels. A rectal exam may be done as part of prostate cancer screening to help provide information about the size of your prostate gland. When a rectal exam is performed, it should be done after the PSA level is drawn to avoid any effect on the results. Depending on the PSA results, you may need more tests, such as:  Blood and imaging tests. A procedure to remove tissue samples from your prostate gland for testing (biopsy). This is the only way to know for certain if you have prostate cancer. What are the benefits of prostate cancer screening? Screening can help to identify cancer at an early stage, before symptoms start and when the cancer can be treated more easily. There is a small chance that screening may lower your risk of dying from prostate cancer. The chance is small because prostate cancer is a slow-growing cancer, and most men with prostate cancer die from a different cause. What are the risks of prostate cancer screening? The main risk of prostate cancer screening is diagnosing and treating prostate cancer that would never have caused  any symptoms or problems. This is called overdiagnosisand overtreatment. PSA screening cannot tell you if your PSA is high due to cancer or a different cause. A prostate biopsy is the only procedure to diagnose prostate cancer. Even the results of a biopsy may not tell you if your cancer needs to be treated. Slow-growing prostate cancer may not need any treatment other than monitoring,  so diagnosing and treating it may cause unnecessary stress or other side effects. Questions to ask your health care provider When should I start prostate cancer screening? What is my risk for prostate cancer? How often do I need screening? What type of screening tests do I need? How do I get my test results? What do my results mean? Do I need treatment? Where to find more information The American Cancer Society: www.cancer.org American Urological Association: www.auanet.org Contact a health care provider if: You have difficulty urinating. You have pain when you urinate or ejaculate. You have blood in your urine or semen. You have pain in your back or in the area of your prostate. Summary Prostate cancer is a common type of cancer in men. The prostate gland is located below the bladder and in front of the rectum. This gland adds fluid to semen during ejaculation. Prostate cancer screening may identify cancer at an early stage, when the cancer can be treated more easily and is less likely to have spread to other areas of the body. The prostate-specific antigen (PSA) test is the recommended screening test for prostate cancer, but it has associated risks. Discuss the risks and benefits of prostate cancer screening with your health care provider. If you are age 36 or older, the risks that screening can cause are greater than the benefits that it may provide. This information is not intended to replace advice given to you by your health care provider. Make sure you discuss any questions you have with your health care provider. Asymptomatic Bacteriuria Asymptomatic bacteriuria is when you have a lot of germs called bacteria in your pee (urine), but they don't cause symptoms.  You may not need treatment for this condition.  What are the causes? You may get more germs in your pee because of: Germs going into your urinary tract. Your urinary tract includes your kidneys, ureters, bladder, and  urethra. Germs can get into your urinary tract during sex. A blockage in your urinary tract. This may be from a kidney stone or from an abnormal growth of cells called a tumor. Bladder problems that stop the bladder from emptying. What increases the risk? You're more likely to get this condition if: You have diabetes. You're an older adult. You're even more likely to get it if you live in a long-term care facility. You're male. You're pregnant. You have kidney stones. You've had a kidney transplant. You have a soft tube called a catheter that drains your pee. What are the signs or symptoms? There are no symptoms. How is this diagnosed? This condition is diagnosed with a pee test. It may be found when a sample of pee is taken to treat another condition, such as a problem with your kidney. If you're in your first trimester of pregnancy, you may be screened for this condition. How is this treated? In most cases, treatment isn't needed. Treating the condition can lead to other problems, such as: A yeast infection. The growth of antibiotic-resistant bacteria. These are germs that don't respond to treatment. But some people need to take antibiotics to prevent a kidney infection.  You may need treatment if: You're having a procedure that affects your urinary tract. You've had a kidney transplant. You're pregnant. A kidney infection during pregnancy can lead to: Early labor. Very low birth weight. Newborn death. Follow these instructions at home: Medicines Take your medicines only as told. Take your antibiotics as told. Do not stop taking them even if you start to feel better. General instructions Closely watch your condition for any changes. Drink enough fluid to keep your pee pale yellow. Pee more often to keep your bladder empty. If you're male, keep the area around your vagina and butt clean. Wipe from front to back after you pee or poop. Use each tissue only once when you  wipe. Contact a health care provider if: You have symptoms of an infection. These symptoms may include: A burning feeling, or pain when you pee. A strong need to pee. Peeing more often. Pee that turns discolored or cloudy. Blood in your pee. Pee that smells bad or odd. A fever or chills. You have very bad pain in your back or lower belly. You faint. This information is not intended to replace advice given to you by your health care provider. Make sure you discuss any questions you have with your health care provider. Document Revised: 05/26/2022 Document Reviewed: 05/26/2022 Elsevier Patient Education  2024 [employer].

## 2023-09-16 ENCOUNTER — Encounter: Payer: Self-pay | Admitting: Dermatology

## 2023-09-16 ENCOUNTER — Ambulatory Visit: Payer: Medicare PPO | Admitting: Dermatology

## 2023-09-16 DIAGNOSIS — L738 Other specified follicular disorders: Secondary | ICD-10-CM

## 2023-09-16 DIAGNOSIS — L821 Other seborrheic keratosis: Secondary | ICD-10-CM | POA: Diagnosis not present

## 2023-09-16 DIAGNOSIS — W908XXA Exposure to other nonionizing radiation, initial encounter: Secondary | ICD-10-CM | POA: Diagnosis not present

## 2023-09-16 DIAGNOSIS — L570 Actinic keratosis: Secondary | ICD-10-CM | POA: Diagnosis not present

## 2023-09-16 DIAGNOSIS — L814 Other melanin hyperpigmentation: Secondary | ICD-10-CM | POA: Diagnosis not present

## 2023-09-16 DIAGNOSIS — Z1283 Encounter for screening for malignant neoplasm of skin: Secondary | ICD-10-CM | POA: Diagnosis not present

## 2023-09-16 DIAGNOSIS — D2330 Other benign neoplasm of skin of unspecified part of face: Secondary | ICD-10-CM

## 2023-09-16 DIAGNOSIS — D492 Neoplasm of unspecified behavior of bone, soft tissue, and skin: Secondary | ICD-10-CM | POA: Diagnosis not present

## 2023-09-16 DIAGNOSIS — L578 Other skin changes due to chronic exposure to nonionizing radiation: Secondary | ICD-10-CM

## 2023-09-16 DIAGNOSIS — D1801 Hemangioma of skin and subcutaneous tissue: Secondary | ICD-10-CM

## 2023-09-16 DIAGNOSIS — D229 Melanocytic nevi, unspecified: Secondary | ICD-10-CM

## 2023-09-16 NOTE — Patient Instructions (Addendum)

## 2023-09-16 NOTE — Progress Notes (Signed)
 Follow-Up Visit   Subjective  Gary Escobar is a 73 y.o. male who presents for the following: Skin Cancer Screening and Full Body Skin Exam, hx of Dysplastic nevus.   The patient presents for Total-Body Skin Exam (TBSE) for skin cancer screening and mole check. The patient has spots, moles and lesions to be evaluated, some may be new or changing and the patient may have concern these could be cancer.    The following portions of the chart were reviewed this encounter and updated as appropriate: medications, allergies, medical history  Review of Systems:  No other skin or systemic complaints except as noted in HPI or Assessment and Plan.  Objective  Well appearing patient in no apparent distress; mood and affect are within normal limits.  A full examination was performed including scalp, head, eyes, ears, nose, lips, neck, chest, axillae, abdomen, back, buttocks, bilateral upper extremities, bilateral lower extremities, hands, feet, fingers, toes, fingernails, and toenails. All findings within normal limits unless otherwise noted below.   Relevant physical exam findings are noted in the Assessment and Plan.  right cheek 4.0 mm pink papule with telangiectasis   left zygoma x 1 Erythematous thin papules/macules with gritty scale.   Assessment & Plan   SKIN CANCER SCREENING PERFORMED TODAY.  LENTIGINES, SEBORRHEIC KERATOSES, HEMANGIOMAS - Benign normal skin lesions - Benign-appearing - Call for any changes  MELANOCYTIC NEVI - Tan-brown and/or pink-flesh-colored symmetric macules and papules - Benign appearing on exam today - Observation - Call clinic for new or changing moles - Recommend daily use of broad spectrum spf 30+ sunscreen to sun-exposed areas.    Sebaceous Hyperplasia - Small yellow papules with a central dell - Benign-appearing - Observe. Call for changes.    HISTORY OF DYSPLASTIC NEVUS Left lateral thigh Right mid back Left upper arm  No evidence of  recurrence today Recommend regular full body skin exams Recommend daily broad spectrum sunscreen SPF 30+ to sun-exposed areas, reapply every 2 hours as needed.  Call if any new or changing lesions are noted between office visits   NEOPLASM OF SKIN right cheek Skin / nail biopsy Type of biopsy: tangential   Informed consent: discussed and consent obtained   Timeout: patient name, date of birth, surgical site, and procedure verified   Procedure prep:  Patient was prepped and draped in usual sterile fashion Prep type:  Isopropyl alcohol Anesthesia: the lesion was anesthetized in a standard fashion   Anesthetic:  1% lidocaine w/ epinephrine 1-100,000 buffered w/ 8.4% NaHCO3 Instrument used: DermaBlade   Hemostasis achieved with: pressure and aluminum chloride   Outcome: patient tolerated procedure well   Post-procedure details: sterile dressing applied and wound care instructions given   Dressing type: bandage and petrolatum    Specimen 1 - Surgical pathology Differential Diagnosis: R/O Sebaceous Hyperplasia vs BCC  Check Margins: No Sebaceous Hyperplasia vs sebaceous adenoma vs BCC AK (ACTINIC KERATOSIS) left zygoma x 1 ACTINIC DAMAGE - chronic, secondary to cumulative UV radiation exposure/sun exposure over time - diffuse scaly erythematous macules with underlying dyspigmentation - Recommend daily broad spectrum sunscreen SPF 30+ to sun-exposed areas, reapply every 2 hours as needed.  - Recommend staying in the shade or wearing long sleeves, sun glasses (UVA+UVB protection) and wide brim hats (4-inch brim around the entire circumference of the hat). - Call for new or changing lesions.  Destruction of lesion - left zygoma x 1 Complexity: simple   Destruction method: cryotherapy   Informed consent: discussed and consent obtained  Timeout:  patient name, date of birth, surgical site, and procedure verified Lesion destroyed using liquid nitrogen: Yes   Region frozen until ice ball  extended beyond lesion: Yes   Cryo cycles: 1 or 2. Outcome: patient tolerated procedure well with no complications   Post-procedure details: wound care instructions given    MULTIPLE BENIGN NEVI   LENTIGINES   SEBORRHEIC KERATOSES   ACTINIC ELASTOSIS   CHERRY ANGIOMA   SEBACEOUS HYPERPLASIA     Return in about 1 year (around 09/15/2024) for TBSE, hx of Dysplastic nevus .  IFay Kirks, CMA, am acting as scribe for Boneta Sharps, MD .   Documentation: I have reviewed the above documentation for accuracy and completeness, and I agree with the above.  Boneta Sharps, MD

## 2023-09-20 ENCOUNTER — Ambulatory Visit: Payer: Self-pay | Admitting: Dermatology

## 2023-09-20 LAB — SURGICAL PATHOLOGY

## 2024-07-19 ENCOUNTER — Ambulatory Visit: Admitting: Urology

## 2024-09-21 ENCOUNTER — Encounter: Admitting: Dermatology
